# Patient Record
Sex: Male | Born: 1950
Health system: Southern US, Community
[De-identification: ages and names within clinical notes are randomized; demographics above are authoritative.]

## PROBLEM LIST (undated history)

## (undated) DIAGNOSIS — F32A Depression, unspecified: Secondary | ICD-10-CM

## (undated) DIAGNOSIS — E785 Hyperlipidemia, unspecified: Secondary | ICD-10-CM

## (undated) DIAGNOSIS — I10 Essential (primary) hypertension: Secondary | ICD-10-CM

## (undated) DIAGNOSIS — F1910 Other psychoactive substance abuse, uncomplicated: Secondary | ICD-10-CM

## (undated) DIAGNOSIS — K219 Gastro-esophageal reflux disease without esophagitis: Secondary | ICD-10-CM

## (undated) DIAGNOSIS — F329 Major depressive disorder, single episode, unspecified: Secondary | ICD-10-CM

## (undated) HISTORY — DX: Gastro-esophageal reflux disease without esophagitis: K21.9

## (undated) HISTORY — DX: Depression, unspecified: F32.A

## (undated) HISTORY — DX: Major depressive disorder, single episode, unspecified: F32.9

## (undated) HISTORY — DX: Other psychoactive substance abuse, uncomplicated: F19.10

## (undated) HISTORY — DX: Essential (primary) hypertension: I10

## (undated) HISTORY — DX: Hyperlipidemia, unspecified: E78.5

## (undated) HISTORY — PX: EYE SURGERY: SHX253

---

## 2005-02-07 ENCOUNTER — Ambulatory Visit: Payer: Self-pay | Admitting: Internal Medicine

## 2005-04-14 ENCOUNTER — Ambulatory Visit: Payer: Self-pay | Admitting: Internal Medicine

## 2006-05-11 ENCOUNTER — Ambulatory Visit: Payer: Self-pay | Admitting: Internal Medicine

## 2006-06-22 ENCOUNTER — Ambulatory Visit: Payer: Self-pay | Admitting: Internal Medicine

## 2006-09-23 ENCOUNTER — Ambulatory Visit: Payer: Self-pay | Admitting: Family Medicine

## 2006-12-24 ENCOUNTER — Ambulatory Visit: Payer: Self-pay | Admitting: Internal Medicine

## 2007-02-01 ENCOUNTER — Ambulatory Visit: Payer: Self-pay | Admitting: Internal Medicine

## 2007-05-26 ENCOUNTER — Ambulatory Visit: Payer: Self-pay | Admitting: Internal Medicine

## 2007-05-26 DIAGNOSIS — I10 Essential (primary) hypertension: Secondary | ICD-10-CM | POA: Insufficient documentation

## 2007-05-26 DIAGNOSIS — F329 Major depressive disorder, single episode, unspecified: Secondary | ICD-10-CM | POA: Insufficient documentation

## 2007-05-26 DIAGNOSIS — F339 Major depressive disorder, recurrent, unspecified: Secondary | ICD-10-CM | POA: Insufficient documentation

## 2007-06-02 ENCOUNTER — Telehealth (INDEPENDENT_AMBULATORY_CARE_PROVIDER_SITE_OTHER): Payer: Self-pay | Admitting: *Deleted

## 2007-06-02 LAB — CONVERTED CEMR LAB
BUN: 16 mg/dL (ref 6–23)
Basophils Absolute: 0 10*3/uL (ref 0.0–0.1)
Basophils Relative: 0 % (ref 0.0–1.0)
CO2: 28 meq/L (ref 19–32)
Calcium: 10 mg/dL (ref 8.4–10.5)
Chloride: 102 meq/L (ref 96–112)
Creatinine, Ser: 1.6 mg/dL — ABNORMAL HIGH (ref 0.4–1.5)
Eosinophils Absolute: 0.3 10*3/uL (ref 0.0–0.6)
Eosinophils Relative: 3.1 % (ref 0.0–5.0)
GFR calc Af Amer: 58 mL/min
GFR calc non Af Amer: 48 mL/min
Glucose, Bld: 93 mg/dL (ref 70–99)
HCT: 40.9 % (ref 39.0–52.0)
Hemoglobin: 13.9 g/dL (ref 13.0–17.0)
Lymphocytes Relative: 18.3 % (ref 12.0–46.0)
MCHC: 33.9 g/dL (ref 30.0–36.0)
MCV: 96.3 fL (ref 78.0–100.0)
Monocytes Absolute: 0.6 10*3/uL (ref 0.2–0.7)
Monocytes Relative: 6.9 % (ref 3.0–11.0)
Neutro Abs: 5.8 10*3/uL (ref 1.4–7.7)
Neutrophils Relative %: 71.7 % (ref 43.0–77.0)
Platelets: 546 10*3/uL — ABNORMAL HIGH (ref 150–400)
Potassium: 4 meq/L (ref 3.5–5.1)
RBC: 4.25 M/uL (ref 4.22–5.81)
RDW: 11.9 % (ref 11.5–14.6)
Sodium: 138 meq/L (ref 135–145)
TSH: 2.7 microintl units/mL (ref 0.35–5.50)
WBC: 8.2 10*3/uL (ref 4.5–10.5)

## 2007-07-16 ENCOUNTER — Ambulatory Visit: Payer: Self-pay | Admitting: Internal Medicine

## 2007-08-16 ENCOUNTER — Ambulatory Visit: Payer: Self-pay | Admitting: Internal Medicine

## 2007-10-01 ENCOUNTER — Ambulatory Visit: Payer: Self-pay | Admitting: Internal Medicine

## 2008-06-16 ENCOUNTER — Ambulatory Visit: Payer: Self-pay | Admitting: Internal Medicine

## 2008-06-16 DIAGNOSIS — D759 Disease of blood and blood-forming organs, unspecified: Secondary | ICD-10-CM | POA: Insufficient documentation

## 2008-06-16 LAB — CONVERTED CEMR LAB
BUN: 14 mg/dL (ref 6–23)
CO2: 30 meq/L (ref 19–32)
Eosinophils Relative: 2.9 % (ref 0.0–5.0)
GFR calc Af Amer: 81 mL/min
Glucose, Bld: 98 mg/dL (ref 70–99)
HCT: 40.9 % (ref 39.0–52.0)
Hemoglobin: 14.3 g/dL (ref 13.0–17.0)
Lymphocytes Relative: 24.8 % (ref 12.0–46.0)
Monocytes Absolute: 0.5 10*3/uL (ref 0.1–1.0)
Monocytes Relative: 9.8 % (ref 3.0–12.0)
Neutro Abs: 3.1 10*3/uL (ref 1.4–7.7)
Platelets: 428 10*3/uL — ABNORMAL HIGH (ref 150–400)
Potassium: 4 meq/L (ref 3.5–5.1)
RDW: 12.2 % (ref 11.5–14.6)
Sodium: 141 meq/L (ref 135–145)
WBC: 5 10*3/uL (ref 4.5–10.5)

## 2008-10-09 ENCOUNTER — Ambulatory Visit: Payer: Self-pay | Admitting: Internal Medicine

## 2008-10-09 LAB — CONVERTED CEMR LAB
Cholesterol: 235 mg/dL (ref 0–200)
Direct LDL: 164.3 mg/dL
Total CHOL/HDL Ratio: 5.6
Triglycerides: 218 mg/dL (ref 0–149)
VLDL: 44 mg/dL — ABNORMAL HIGH (ref 0–40)

## 2008-10-30 ENCOUNTER — Telehealth: Payer: Self-pay | Admitting: Internal Medicine

## 2008-11-02 ENCOUNTER — Telehealth: Payer: Self-pay | Admitting: Internal Medicine

## 2009-01-15 ENCOUNTER — Ambulatory Visit: Payer: Self-pay | Admitting: Internal Medicine

## 2009-01-15 DIAGNOSIS — M26609 Unspecified temporomandibular joint disorder, unspecified side: Secondary | ICD-10-CM | POA: Insufficient documentation

## 2009-01-29 ENCOUNTER — Telehealth: Payer: Self-pay | Admitting: Internal Medicine

## 2009-02-07 ENCOUNTER — Telehealth: Payer: Self-pay | Admitting: Internal Medicine

## 2009-05-16 ENCOUNTER — Ambulatory Visit: Payer: Self-pay | Admitting: Family Medicine

## 2009-05-16 DIAGNOSIS — R6882 Decreased libido: Secondary | ICD-10-CM | POA: Insufficient documentation

## 2009-05-16 DIAGNOSIS — E785 Hyperlipidemia, unspecified: Secondary | ICD-10-CM | POA: Insufficient documentation

## 2009-05-17 LAB — CONVERTED CEMR LAB
Direct LDL: 180.2 mg/dL
HDL: 49.7 mg/dL (ref >39.00)
Testosterone: 257.42 ng/dL — ABNORMAL LOW (ref 350.00–890.00)
Total CHOL/HDL Ratio: 5
VLDL: 27.4 mg/dL (ref 0.0–40.0)

## 2009-05-29 ENCOUNTER — Telehealth (INDEPENDENT_AMBULATORY_CARE_PROVIDER_SITE_OTHER): Payer: Self-pay | Admitting: *Deleted

## 2009-06-19 ENCOUNTER — Telehealth (INDEPENDENT_AMBULATORY_CARE_PROVIDER_SITE_OTHER): Payer: Self-pay | Admitting: *Deleted

## 2009-08-06 ENCOUNTER — Telehealth: Payer: Self-pay | Admitting: Internal Medicine

## 2009-08-07 ENCOUNTER — Encounter: Payer: Self-pay | Admitting: Internal Medicine

## 2009-10-26 ENCOUNTER — Ambulatory Visit: Payer: Self-pay | Admitting: Internal Medicine

## 2009-10-26 DIAGNOSIS — J069 Acute upper respiratory infection, unspecified: Secondary | ICD-10-CM | POA: Insufficient documentation

## 2009-10-26 LAB — CONVERTED CEMR LAB: AST: 38 units/L — ABNORMAL HIGH (ref 0–37)

## 2010-02-19 ENCOUNTER — Ambulatory Visit: Payer: Self-pay | Admitting: Internal Medicine

## 2010-02-19 DIAGNOSIS — K5289 Other specified noninfective gastroenteritis and colitis: Secondary | ICD-10-CM | POA: Insufficient documentation

## 2010-10-22 NOTE — Assessment & Plan Note (Signed)
Summary: GI ISSUES//CCM   Vital Signs:  Patient profile:   60 year old male Weight:      180 pounds Temp:     97.7 degrees F oral BP sitting:   110 / 78  (left arm) Cuff size:   regular  Vitals Entered By: Duard Brady LPN (Feb 19, 2010 4:30 PM) CC: c/o n/v/d/ low grade fever since the weekend Is Patient Diabetic? No   CC:  c/o n/v/d/ low grade fever since the weekend.  History of Present Illness: 60 -year-old patient  who presents with a 3-day history of nausea, vomiting, and diarrhea.  He is improved to the and has not had any diarrhea or vomiting.  In 24 hours.  He states that he is hungry, but has not eaten today.  Denies any abdominal pain.  He has treated hypertension and dyslipidemia.  Preventive Screening-Counseling & Management  Alcohol-Tobacco     Smoking Status: never  Allergies: 1)  Amoxicillin (Amoxicillin) 2)  Penicillin V Potassium (Penicillin V Potassium)  Physical Exam  General:  Well-developed,well-nourished,in no acute distress; alert,appropriate and cooperative throughout examination Head:  Normocephalic and atraumatic without obvious abnormalities. No apparent alopecia or balding. Eyes:  No corneal or conjunctival inflammation noted. EOMI. Perrla. Funduscopic exam benign, without hemorrhages, exudates or papilledema. Vision grossly normal. Ears:  External ear exam shows no significant lesions or deformities.  Otoscopic examination reveals clear canals, tympanic membranes are intact bilaterally without bulging, retraction, inflammation or discharge. Hearing is grossly normal bilaterally. Mouth:  Oral mucosa and oropharynx without lesions or exudates.  Teeth in good repair. Neck:  No deformities, masses, or tenderness noted. Lungs:  Normal respiratory effort, chest expands symmetrically. Lungs are clear to auscultation, no crackles or wheezes. Heart:  Normal rate and regular rhythm. S1 and S2 normal without gallop, murmur, click, rub or other extra  sounds.  no  tachycardia Abdomen:  Bowel sounds positive,abdomen soft and non-tender without masses, organomegaly or hernias noted.   Impression & Recommendations:  Problem # 1:  GASTROENTERITIS (ICD-558.9)  Problem # 2:  HYPERTENSION (ICD-401.9)  His updated medication list for this problem includes:    Benicar Hct 40-25 Mg Tabs (Olmesartan medoxomil-hctz) .Marland Kitchen... 1 once daily will hold Benicar until he is tolerating a regular diet  Complete Medication List: 1)  Cymbalta 60 Mg Cpep (Duloxetine hcl) .Marland Kitchen.. 1  daily 2)  Androgel 50 Mg/5gm Gel (Testosterone) .... Use every am 3)  Simvastatin 40 Mg Tabs (Simvastatin) .... Take 1 tablet by mouth once a day 4)  Benicar Hct 40-25 Mg Tabs (Olmesartan medoxomil-hctz) .Marland Kitchen.. 1 once daily 5)  Promethazine Hcl 25 Mg Tabs (Promethazine hcl) .... One every 6 hours for nausea  Patient Instructions: 1)  The  main problem with gastroenteritis is dehydration. Drink plenty of fluids and take solids as you feel better. If you are unable to keep anything down and/or you show signs of dehydration(dry/cracked lips, lack of tears, not urinating, very sleepy), call our office. Prescriptions: PROMETHAZINE HCL 25 MG TABS (PROMETHAZINE HCL) one every 6 hours for nausea  #12 x 0   Entered and Authorized by:   Gordy Savers  MD   Signed by:   Gordy Savers  MD on 02/19/2010   Method used:   Print then Give to Patient   RxID:   [id number]

## 2010-10-22 NOTE — Assessment & Plan Note (Signed)
Summary: FEVER, ACHING, CONGESTION (FLU-LIKE SYMPTOMS) // RS   Vital Signs:  Patient profile:   60 year old male Weight:      183 pounds Temp:     97.5 degrees F oral BP sitting:   108 / 84  (left arm) Cuff size:   regular  Vitals Entered By: Raechel Ache, RN (October 26, 2009 1:23 PM) CC: C/o body aches, congestion, headache and cough   CC:  C/o body aches, congestion, and headache and cough.  History of Present Illness: 60 year old patient has a history of hypertension, and dyslipidemia, who presents with a one-week history of a flulike illness.  He has had cough, congestion, and a predominant symptom of muscle aches and pain.  He has felt feverish and has some occasional mild chills.  No sputum production, chest pain or shortness of breath.  He was placed on simvastatin for a number of months ago.  Allergies: 1)  Amoxicillin (Amoxicillin) 2)  Penicillin V Potassium (Penicillin V Potassium)  Past History:  Past Medical History: Reviewed history from 05/26/2007 and no changes required. Depression Hypertension  Review of Systems       The patient complains of anorexia, fever, and peripheral edema.  The patient denies weight loss, weight gain, vision loss, decreased hearing, hoarseness, chest pain, syncope, dyspnea on exertion, prolonged cough, headaches, hemoptysis, abdominal pain, melena, hematochezia, severe indigestion/heartburn, hematuria, incontinence, genital sores, muscle weakness, suspicious skin lesions, transient blindness, difficulty walking, depression, unusual weight change, abnormal bleeding, enlarged lymph nodes, angioedema, breast masses, and testicular masses.    Physical Exam  General:  Well-developed,well-nourished,in no acute distress; alert,appropriate and cooperative throughout examination Head:  Normocephalic and atraumatic without obvious abnormalities. No apparent alopecia or balding. Eyes:  No corneal or conjunctival inflammation noted. EOMI.  Perrla. Funduscopic exam benign, without hemorrhages, exudates or papilledema. Vision grossly normal. Ears:  External ear exam shows no significant lesions or deformities.  Otoscopic examination reveals clear canals, tympanic membranes are intact bilaterally without bulging, retraction, inflammation or discharge. Hearing is grossly normal bilaterally. Mouth:  Oral mucosa and oropharynx without lesions or exudates.  Teeth in good repair. Neck:  No deformities, masses, or tenderness noted. Lungs:  Normal respiratory effort, chest expands symmetrically. Lungs are clear to auscultation, no crackles or wheezes. O2 saturation 98 Heart:  Normal rate and regular rhythm. S1 and S2 normal without gallop, murmur, click, rub or other extra sounds. pulse rate 72   Impression & Recommendations:  Problem # 1:  URI (ICD-465.9)  Problem # 2:  HYPERLIPIDEMIA (ICD-272.4)  His updated medication list for this problem includes:    Simvastatin 40 Mg Tabs (Simvastatin) .Marland Kitchen... Take 1 tablet by mouth once a day  Orders: Venipuncture (30160) TLB-AST (SGOT) (84450-SGOT) TLB-Cholesterol, Total (82465-CHO)  Problem # 3:  HYPERTENSION (ICD-401.9)  His updated medication list for this problem includes:    Benicar Hct 40-25 Mg Tabs (Olmesartan medoxomil-hctz) .Marland Kitchen... 1 once daily  Complete Medication List: 1)  Cymbalta 60 Mg Cpep (Duloxetine hcl) .Marland Kitchen.. 1  daily 2)  Androgel 50 Mg/5gm Gel (Testosterone) .... Use every am 3)  Simvastatin 40 Mg Tabs (Simvastatin) .... Take 1 tablet by mouth once a day 4)  Benicar Hct 40-25 Mg Tabs (Olmesartan medoxomil-hctz) .Marland Kitchen.. 1 once daily 5)  Hydrocodone-acetaminophen 5-500 Mg Tabs (Hydrocodone-acetaminophen) .... One every 6 hours as needed for pain, fever, or cough  Patient Instructions: 1)  Please schedule a follow-up appointment in 3 months. 2)  Limit your Sodium (Salt). 3)  It is important that you exercise regularly at least 20 minutes 5 times a week. If you develop chest  pain, have severe difficulty breathing, or feel very tired , stop exercising immediately and seek medical attention. Prescriptions: HYDROCODONE-ACETAMINOPHEN 5-500 MG TABS (HYDROCODONE-ACETAMINOPHEN) one every 6 hours as needed for pain, fever, or cough  #30 x 0   Entered and Authorized by:   Gordy Savers  MD   Signed by:   Gordy Savers  MD on 10/26/2009   Method used:   Print then Give to Patient   RxID:   [id number] SIMVASTATIN 40 MG TABS (SIMVASTATIN) Take 1 tablet by mouth once a day  #90 Tablet x 6   Entered and Authorized by:   Gordy Savers  MD   Signed by:   Gordy Savers  MD on 10/26/2009   Method used:   Print then Give to Patient   RxID:   [id number] BENICAR HCT 40-25 MG TABS (OLMESARTAN MEDOXOMIL-HCTZ) 1 once daily  #90 x 6   Entered and Authorized by:   Gordy Savers  MD   Signed by:   Gordy Savers  MD on 10/26/2009   Method used:   Print then Give to Patient   RxID:   [id number] ANDROGEL 50 MG/5GM GEL (TESTOSTERONE) use every am  #30 x 6   Entered and Authorized by:   Gordy Savers  MD   Signed by:   Gordy Savers  MD on 10/26/2009   Method used:   Print then Give to Patient   RxID:   [id number] CYMBALTA 60 MG CPEP (DULOXETINE HCL) 1  daily  #90 x 4   Entered and Authorized by:   Gordy Savers  MD   Signed by:   Gordy Savers  MD on 10/26/2009   Method used:   Print then Give to Patient   RxID:   [id number]

## 2010-11-21 ENCOUNTER — Other Ambulatory Visit: Payer: Self-pay | Admitting: Internal Medicine

## 2010-12-08 ENCOUNTER — Other Ambulatory Visit: Payer: Self-pay | Admitting: Internal Medicine

## 2010-12-08 DIAGNOSIS — E785 Hyperlipidemia, unspecified: Secondary | ICD-10-CM

## 2011-02-07 NOTE — Assessment & Plan Note (Signed)
[facility]                              [facility] OFFICE NOTE   NAME:Sean Mejia, Sean Mejia                      MRN:          [id number]  DATE:05/11/2006                            DOB:          [birth year]    A 60 year old gentleman seen today for an annual exam.  He has a history of  borderline hypertension in the past.  He is followed by Dr. Lafayette Dragon for a  history of severe depression.  Past medical history otherwise fairly  unremarkable.  Has had bilateral eye surgery for pterygiums in the past.   FAMILY HISTORY:  Positive for premature coronary artery disease.  He has had  two ET tubes in the past, the last done in 2005.  He had a colonoscopy also  in 2005.   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   EXAMINATION:  GENERAL:  Reveals a healthy-appearing fit male.  In no acute  distress.  VITAL SIGNS:  Blood pressure was 160/104.  HEENT:  Fundi, ears, nose and throat clear.  NECK:  No bruits or adenopathy.  CHEST:  Clear.  CARDIOVASCULAR:  Normal heart sounds, no murmurs.  ABDOMEN:  Benign.  EXTERNAL GENITALIA:  Normal.  RECTAL:  Prostate minimally enlarged and stool was heme test negative.  EXTREMITIES:  Reveal faint peripheral pulses.  Feet were slightly cool.  NEURO:  Negative.   IMPRESSION:  1. Hypertension.  2. History of severe depression.   DISPOSITION:  Will place on Benicar 20/12.5.  Will recheck in 6 weeks.  Laboratory studies reviewed.                                   Gordy Savers, MD   PFK/MedQ  DD:  05/11/2006  DT:  05/11/2006  Job #:  [id number]

## 2011-02-28 ENCOUNTER — Other Ambulatory Visit: Payer: Self-pay | Admitting: Internal Medicine

## 2011-03-11 ENCOUNTER — Other Ambulatory Visit: Payer: Self-pay | Admitting: Internal Medicine

## 2011-03-18 ENCOUNTER — Other Ambulatory Visit (INDEPENDENT_AMBULATORY_CARE_PROVIDER_SITE_OTHER): Payer: BC Managed Care – PPO

## 2011-03-18 DIAGNOSIS — E291 Testicular hypofunction: Secondary | ICD-10-CM

## 2011-03-18 DIAGNOSIS — E349 Endocrine disorder, unspecified: Secondary | ICD-10-CM

## 2011-03-18 DIAGNOSIS — Z0000 Encounter for general adult medical examination without abnormal findings: Secondary | ICD-10-CM

## 2011-03-18 LAB — POCT URINALYSIS DIPSTICK
Blood, UA: NEGATIVE
Nitrite, UA: NEGATIVE
Urobilinogen, UA: 0.2
pH, UA: 5.5

## 2011-03-18 LAB — HEPATIC FUNCTION PANEL
Bilirubin, Direct: 0.1 mg/dL (ref 0.0–0.3)
Total Bilirubin: 0.4 mg/dL (ref 0.3–1.2)

## 2011-03-18 LAB — TSH: TSH: 0.94 u[IU]/mL (ref 0.35–5.50)

## 2011-03-18 LAB — CBC WITH DIFFERENTIAL/PLATELET
Eosinophils Absolute: 0.1 10*3/uL (ref 0.0–0.7)
Lymphocytes Relative: 25.2 % (ref 12.0–46.0)
MCHC: 33.9 g/dL (ref 30.0–36.0)
MCV: 94.3 fl (ref 78.0–100.0)
Monocytes Absolute: 0.6 10*3/uL (ref 0.1–1.0)
Neutrophils Relative %: 60.3 % (ref 43.0–77.0)
Platelets: 326 10*3/uL (ref 150.0–400.0)
WBC: 5.5 10*3/uL (ref 4.5–10.5)

## 2011-03-18 LAB — BASIC METABOLIC PANEL
BUN: 18 mg/dL (ref 6–23)
Calcium: 9.7 mg/dL (ref 8.4–10.5)
Creatinine, Ser: 1.1 mg/dL (ref 0.4–1.5)
GFR: 73.03 mL/min (ref >60.00)
Glucose, Bld: 75 mg/dL (ref 70–99)

## 2011-03-18 LAB — LIPID PANEL
HDL: 48.3 mg/dL (ref >39.00)
LDL Cholesterol: 92 mg/dL (ref 0–99)
Total CHOL/HDL Ratio: 3
Triglycerides: 142 mg/dL (ref 0.0–149.0)
VLDL: 28.4 mg/dL (ref 0.0–40.0)

## 2011-03-27 ENCOUNTER — Encounter: Payer: Self-pay | Admitting: Internal Medicine

## 2011-03-31 ENCOUNTER — Ambulatory Visit (INDEPENDENT_AMBULATORY_CARE_PROVIDER_SITE_OTHER): Payer: BC Managed Care – PPO | Admitting: Internal Medicine

## 2011-03-31 ENCOUNTER — Encounter: Payer: Self-pay | Admitting: Internal Medicine

## 2011-03-31 VITALS — BP 100/60 | HR 86 | Temp 98.5°F | Resp 16 | Ht 68.75 in | Wt 184.0 lb

## 2011-03-31 DIAGNOSIS — Z0000 Encounter for general adult medical examination without abnormal findings: Secondary | ICD-10-CM

## 2011-03-31 DIAGNOSIS — Z23 Encounter for immunization: Secondary | ICD-10-CM

## 2011-03-31 MED ORDER — DULOXETINE HCL 60 MG PO CPEP: 60.0000 mg | ORAL_CAPSULE | Freq: Every day | ORAL | Status: DC

## 2011-03-31 MED ORDER — OLMESARTAN MEDOXOMIL-HCTZ 40-25 MG PO TABS: 1.0000 | ORAL_TABLET | Freq: Every day | ORAL | Status: DC

## 2011-03-31 MED ORDER — SIMVASTATIN 40 MG PO TABS: 40.0000 mg | ORAL_TABLET | Freq: Every day | ORAL | Status: DC

## 2011-03-31 MED ORDER — TESTOSTERONE 20.25 MG/ACT (1.62%) TD GEL: 2.0000 "application " | TRANSDERMAL | Status: DC

## 2011-03-31 NOTE — Patient Instructions (Signed)
It is important that you exercise regularly, at least 20 minutes 3 to 4 times per week.  If you develop chest pain or shortness of breath seek  medical attention.  Please check your blood pressure on a regular basis.  If it is consistently greater than 150/90, please make an office appointment.  Return in 6 months for follow-up  

## 2011-03-31 NOTE — Progress Notes (Signed)
  Subjective:    Patient ID: Sean Mejia, male    DOB: 1951-08-09, 60 y.o.   MRN: [id number]  HPIHistory of Present Illness:   60 year-old gentleman seen today for a comprehensive annual exam. medical problems include hypertension, and bipolar depression; he is on simvastatin for dyslipidemia. He also has a history of testosterone deficiency  Current Allergies:  AMOXICILLIN (AMOXICILLIN)   Past Medical History:  Reviewed history from 05/26/2007 and no changes required:  Depression  Hypertension   Past Surgical History:  Reviewed history from 05/26/2007 and no changes required:  Denies surgical history  colonoscopy age 60   Family History:  father deceased from MS age 51- of acute MI  mother deceased from copd age 6; history of SLE  Four brothers, one with a history of bladder cancer      Review of Systems  Constitutional: Negative for fever, chills, activity change, appetite change and fatigue.  HENT: Negative for hearing loss, ear pain, congestion, rhinorrhea, sneezing, mouth sores, trouble swallowing, neck pain, neck stiffness, dental problem, voice change, sinus pressure and tinnitus.   Eyes: Negative for photophobia, pain, redness and visual disturbance.  Respiratory: Negative for apnea, cough, choking, chest tightness, shortness of breath and wheezing.   Cardiovascular: Negative for chest pain, palpitations and leg swelling.  Gastrointestinal: Negative for nausea, vomiting, abdominal pain, diarrhea, constipation, blood in stool, abdominal distention, anal bleeding and rectal pain.  Genitourinary: Negative for dysuria, urgency, frequency, hematuria, flank pain, decreased urine volume, discharge, penile swelling, scrotal swelling, difficulty urinating, genital sores and testicular pain.  Musculoskeletal: Negative for myalgias, back pain, joint swelling, arthralgias and gait problem.  Skin: Negative for color change, rash and wound.  Neurological: Negative for dizziness,  tremors, seizures, syncope, facial asymmetry, speech difficulty, weakness, light-headedness, numbness and headaches.  Hematological: Negative for adenopathy. Does not bruise/bleed easily.  Psychiatric/Behavioral: Negative for suicidal ideas, hallucinations, behavioral problems, confusion, sleep disturbance, self-injury, dysphoric mood, decreased concentration and agitation. The patient is not nervous/anxious.        Objective:   Physical Exam  Constitutional: He appears well-developed and well-nourished.  HENT:  Head: Normocephalic and atraumatic.  Right Ear: External ear normal.  Left Ear: External ear normal.  Nose: Nose normal.  Mouth/Throat: Oropharynx is clear and moist.  Eyes: Conjunctivae and EOM are normal. Pupils are equal, round, and reactive to light. No scleral icterus.  Neck: Normal range of motion. Neck supple. No JVD present. No thyromegaly present.  Cardiovascular: Regular rhythm, normal heart sounds and intact distal pulses.  Exam reveals no gallop and no friction rub.   No murmur heard. Pulmonary/Chest: Effort normal and breath sounds normal. He exhibits no tenderness.  Abdominal: Soft. Bowel sounds are normal. He exhibits no distension and no mass. There is no tenderness.  Genitourinary: Prostate normal and penis normal.  Musculoskeletal: Normal range of motion. He exhibits no edema and no tenderness.  Lymphadenopathy:    He has no cervical adenopathy.  Neurological: He is alert. He has normal reflexes. No cranial nerve deficit. Coordination normal.  Skin: Skin is warm and dry. No rash noted.  Psychiatric: He has a normal mood and affect. His behavior is normal.          Assessment & Plan:   Annual health assessment Hypertension well controlled Dyslipidemia Testosterone deficiency. We'll try AndroGel pump 1.62%

## 2011-05-07 ENCOUNTER — Other Ambulatory Visit: Payer: Self-pay | Admitting: Internal Medicine

## 2011-08-19 ENCOUNTER — Other Ambulatory Visit: Payer: Self-pay | Admitting: Internal Medicine

## 2011-10-20 ENCOUNTER — Other Ambulatory Visit: Payer: Self-pay

## 2011-10-20 NOTE — Telephone Encounter (Signed)
ok 

## 2011-10-20 NOTE — Telephone Encounter (Signed)
Rx request for androgel 1.62% place 2 applications to skin once daily.  Rx last filled 03/31/11 and pt last seen same day.  Pls advise.

## 2011-10-21 MED ORDER — TESTOSTERONE 20.25 MG/ACT (1.62%) TD GEL: 2.0000 "application " | TRANSDERMAL | Status: DC

## 2011-10-21 NOTE — Telephone Encounter (Signed)
Rx called in to pharmacy. 

## 2011-11-14 ENCOUNTER — Ambulatory Visit (HOSPITAL_COMMUNITY)
Admission: RE | Admit: 2011-11-14 | Discharge: 2011-11-14 | Disposition: A | Discharge status: Home / Self Care | Payer: BC Managed Care – PPO | Attending: Psychiatry | Admitting: Psychiatry

## 2011-11-14 DIAGNOSIS — F329 Major depressive disorder, single episode, unspecified: Secondary | ICD-10-CM | POA: Insufficient documentation

## 2011-11-14 DIAGNOSIS — F3289 Other specified depressive episodes: Secondary | ICD-10-CM | POA: Insufficient documentation

## 2011-11-14 NOTE — BH Assessment (Signed)
Assessment Note   Sean Mejia is an 61 y.o. male who presented as a walk-in accompanied by his wife and referred by his current psychiatrist for assessment. Sean Mejia presents severely depressed. He states that he has been increasingly more depressed since last Fall. States that there has been changes at work (new employee getting under his skin) and things not going well at work as his main stressor also identifies increased disagreements with his wife d/t increased depression and relapse of alcohol 2 years ago after being sober for years and now only having a 2 month span of sobriety as stressors as well. States that in the last 2 weeks he has become more isolative, increased self-loathing, feels worthless, does not want to be here. Patient states that he got into a disagreement with his wife of 20 years on Wednesday and left and stayed in a hotel. Patient states that on Wednes night he was feeling extremely suicidal and walked to the train track stood on the track and really wanted to be hit by a train but changed his mind and stepped off track. He stated that on Thursday night he walked down to train track again and contemplated standing on track again but did not. He stated that he doesn't feel suicidal today just very depressed.   We discussed the option of IOP or inpatient. He stated that he really did not care for group therapy and asked that I tell him what inpatient was like. He asked if  I could  ask his wife to come back into room so that they could discuss the options. When I returned patient and his wife had let AMA. I attempted to contact the patient on his home phone x3 and left a message for him to call back in the office. I also contacted Dr. Donell Beers to discuss the outcome of the assessment and the fact that the patient had left AMA and  that I did not feel that the patient posed any threat to himself and that he was with his wife. Dr. Donell Beers stated that he would continue to attempt   reach the patient and that he would schedule a follow up appointment with him for next week.   Axis I: Depressive Disorder NOS Axis II: Deferred Axis III:  Past Medical History  Diagnosis Date  . Depression   . Hypertension    Axis IV: occupational problems, other psychosocial or environmental problems and problems related to social environment Axis V: 41-50 serious symptoms  Past Medical History:  Past Medical History  Diagnosis Date  . Depression   . Hypertension     No past surgical history on file.  Family History:  Family History  Problem Relation Age of Onset  . COPD Mother   . Lupus Mother   . Heart attack Father   . Cancer Brother     bladder    Social History:  reports that he has never smoked. He has never used smokeless tobacco. He reports that he drinks alcohol. He reports that he does not use illicit drugs.  Additional Social History:    Allergies:  Allergies  Allergen Reactions  . Amoxicillin     REACTION: unspecified  . Penicillins     REACTION: hives    Home Medications:  Medications Prior to Admission  Medication Sig Dispense Refill  . BENICAR HCT 40-25 MG per tablet TAKE 1 TABLET BY MOUTH EVERY DAY  90 tablet  3  . DULoxetine (CYMBALTA) 60 MG capsule  Take 1 capsule (60 mg total) by mouth daily.  90 capsule  6  . simvastatin (ZOCOR) 40 MG tablet Take 1 tablet (40 mg total) by mouth at bedtime.  906 tablet  0  . Testosterone (ANDROGEL PUMP) 20.25 MG/ACT (1.62%) GEL Place 2 application onto the skin 1 day or 1 dose.  75 g  5  . olmesartan-hydrochlorothiazide (BENICAR HCT) 40-25 MG per tablet Take 1 tablet by mouth daily.  90 tablet  6   No current facility-administered medications on file as of 11/14/2011.    OB/GYN Status:  No LMP for male patient.  General Assessment Data Location of Assessment: [facility] Living Arrangements: Spouse/significant other Can pt return to current living arrangement?: Yes Admission Status:  Voluntary Is patient capable of signing voluntary admission?: Yes Transfer from: Home Referral Source: Psychiatrist  Education Status Is patient currently in school?: No  Risk to self Suicidal Ideation: No-Not Currently/Within Last 6 Months Suicidal Intent: No-Not Currently/Within Last 6 Months Is patient at risk for suicide?: No Suicidal Plan?: No Access to Means: No What has been your use of drugs/alcohol within the last 12 months?:  (No alcohol in the past 2 months) Previous Attempts/Gestures: Yes How many times?:  (twice in last 2 days) Other Self Harm Risks:  (No) Triggers for Past Attempts:  (None) Intentional Self Injurious Behavior: None Family Suicide History:  (Father/ Depression; 3 brothers/depression) Recent stressful life event(s):  (None identified) Persecutory voices/beliefs?: Yes Depression: Yes Depression Symptoms: Despondent;Insomnia;Tearfulness;Isolating;Fatigue;Loss of interest in usual pleasures;Feeling worthless/self pity;Feeling angry/irritable Substance abuse history and/or treatment for substance abuse?: Yes Suicide prevention information given to non-admitted patients:  (No)  Risk to Others Homicidal Ideation: No Thoughts of Harm to Others: No Current Homicidal Intent: No Current Homicidal Plan: No Access to Homicidal Means: No History of harm to others?: No Assessment of Violence: None Noted Violent Behavior Description:  (None) Does patient have access to weapons?: No Criminal Charges Pending?: No Does patient have a court date: No  Psychosis Hallucinations: None noted Delusions: None noted  Mental Status Report Appear/Hygiene:  (WNL) Eye Contact: Fair Motor Activity: Unremarkable Speech: Soft;Slow Level of Consciousness: Alert Mood: Depressed;Ambivalent;Anhedonia;Sad;Sullen;Worthless, low self-esteem Affect:  (Flat) Anxiety Level: None Thought Processes: Coherent;Relevant Judgement: Impaired Orientation:  Person;Place;Time;Situation Obsessive Compulsive Thoughts/Behaviors: None  Cognitive Functioning Concentration: Decreased Memory: Recent Intact;Remote Intact IQ: Average Insight: Fair Impulse Control: Good Appetite: Fair Sleep:  (Difficulty falling asleep,difficulty staying asleep) Total Hours of Sleep:  (Varies) Vegetative Symptoms: Staying in bed  Prior Inpatient Therapy Prior Inpatient Therapy: No  Prior Outpatient Therapy Prior Outpatient Therapy: Yes (Dr. Donell Beers) Prior Therapy Dates:  (Current) Prior Therapy Facilty/Provider(s):  (Psychiatry) Reason for Treatment:  (Medication management)                     Additional Information 1:1 In Past 12 Months?: No CIRT Risk: No Elopement Risk: No Does patient have medical clearance?: No     Disposition:  Disposition Disposition of Patient: Outpatient treatment Type of outpatient treatment: Adult  On Site Evaluation by:   Reviewed with Physician:     Lavonia Dana 11/14/2011 5:45 PM

## 2011-11-20 ENCOUNTER — Telehealth: Payer: Self-pay | Admitting: Internal Medicine

## 2011-11-20 DIAGNOSIS — E349 Endocrine disorder, unspecified: Secondary | ICD-10-CM

## 2011-11-20 NOTE — Telephone Encounter (Signed)
Patient called requesting to be scheduled for labs to have his testosterone levels checked. Please advise.

## 2011-11-20 NOTE — Telephone Encounter (Signed)
Pt on androgel , last checked 02/2011 Please advise

## 2011-11-20 NOTE — Telephone Encounter (Signed)
Please call and schedule early AM lab only appt.  I have pt order in

## 2011-11-20 NOTE — Telephone Encounter (Signed)
Please schedule and check any morning

## 2011-11-24 ENCOUNTER — Other Ambulatory Visit: Payer: Self-pay

## 2011-11-25 ENCOUNTER — Other Ambulatory Visit (INDEPENDENT_AMBULATORY_CARE_PROVIDER_SITE_OTHER): Payer: BC Managed Care – PPO

## 2011-11-25 DIAGNOSIS — E349 Endocrine disorder, unspecified: Secondary | ICD-10-CM

## 2011-11-25 DIAGNOSIS — E291 Testicular hypofunction: Secondary | ICD-10-CM

## 2011-11-26 ENCOUNTER — Telehealth: Payer: Self-pay

## 2011-11-26 DIAGNOSIS — R5383 Other fatigue: Secondary | ICD-10-CM

## 2011-11-26 DIAGNOSIS — E349 Endocrine disorder, unspecified: Secondary | ICD-10-CM

## 2011-11-26 NOTE — Progress Notes (Signed)
Quick Note:  Spoke with pt - informed of lab results and dr. Vernon Prey instructions - he is using med daily - instructed to double dose. Also inquired about seeing a urologist? ______

## 2011-11-26 NOTE — Telephone Encounter (Signed)
Spoke with pt- he would like referral - order done - pt aware terri will contact once done

## 2011-11-26 NOTE — Telephone Encounter (Signed)
Spoke with pt about lab - low testosterone - pt is using androgel daily - instructed to double dose- pt inqiured about seeing a urologist since this has been a problem for quite sometime Please advise

## 2011-11-26 NOTE — Telephone Encounter (Signed)
A urologist is a Careers adviser which he does not need-  ask patient if he would like a referral to endocrinology

## 2011-12-18 ENCOUNTER — Other Ambulatory Visit (INDEPENDENT_AMBULATORY_CARE_PROVIDER_SITE_OTHER): Payer: BC Managed Care – PPO

## 2011-12-18 ENCOUNTER — Encounter: Payer: Self-pay | Admitting: Endocrinology

## 2011-12-18 ENCOUNTER — Ambulatory Visit (INDEPENDENT_AMBULATORY_CARE_PROVIDER_SITE_OTHER): Payer: BC Managed Care – PPO | Admitting: Endocrinology

## 2011-12-18 ENCOUNTER — Ambulatory Visit: Payer: Self-pay | Admitting: Endocrinology

## 2011-12-18 VITALS — BP 114/86 | HR 85 | Temp 97.0°F | Ht 68.0 in | Wt 183.0 lb

## 2011-12-18 DIAGNOSIS — E291 Testicular hypofunction: Secondary | ICD-10-CM

## 2011-12-18 LAB — PROLACTIN: Prolactin: 4.1 ng/mL (ref 2.1–17.1)

## 2011-12-18 NOTE — Progress Notes (Signed)
Subjective:    Patient ID: Sean Mejia, male    DOB: 09/07/1951, 61 y.o.   MRN: [id number]  HPI Pt states few years of slightly decreased muscle strength throughout the body, and assoc lack of libido.  For the past few weeks, he takes 4 actuations/day, of androgel.  The increase has not helped his sxs.  He has no children, by choice, although his wife has been pregnant once.  Past Medical History  Diagnosis Date  . Depression   . Hypertension     No past surgical history on file.  History   Social History  . Marital Status: Married    Spouse Name: N/A    Number of Children: N/A  . Years of Education: N/A   Occupational History  . Not on file.   Social History Main Topics  . Smoking status: Never Smoker   . Smokeless tobacco: Never Used  . Alcohol Use: Yes  . Drug Use: No  . Sexually Active: Not on file   Other Topics Concern  . Not on file   Social History Narrative  . No narrative on file    Current Outpatient Prescriptions on File Prior to Visit  Medication Sig Dispense Refill  . ABILIFY 5 MG tablet Take 5 mg by mouth daily.       Marland Kitchen BENICAR HCT 40-25 MG per tablet TAKE 1 TABLET BY MOUTH EVERY DAY  90 tablet  3  . simvastatin (ZOCOR) 40 MG tablet Take 1 tablet (40 mg total) by mouth at bedtime.  906 tablet  0  . Testosterone (ANDROGEL PUMP) 20.25 MG/ACT (1.62%) GEL Place 2 application onto the skin 1 day or 1 dose.  75 g  5  . DISCONTD: DULoxetine (CYMBALTA) 60 MG capsule Take 1 capsule (60 mg total) by mouth daily.  90 capsule  6  . DISCONTD: olmesartan-hydrochlorothiazide (BENICAR HCT) 40-25 MG per tablet Take 1 tablet by mouth daily.  90 tablet  6    Allergies  Allergen Reactions  . Amoxicillin     REACTION: unspecified  . Penicillins     REACTION: hives    Family History  Problem Relation Age of Onset  . COPD Mother   . Lupus Mother   . Heart attack Father   . Cancer Brother     bladder    BP 114/86  Pulse 85  Temp(Src) 97 F (36.1 C)  (Oral)  Ht 5\' 8"  (1.727 m)  Wt 183 lb (83.008 kg)  BMI 27.82 kg/m2  SpO2 97%    Review of Systems denies polyuria, numbness, erectile dysfunction, weight change, decreased urinary stream, gynecomastia, fever, headache, sob, rash, blurry vision, and chest pain.  He has chronic depression and fatigue.  He has slight myalgias and easy bruising.     Objective:   Physical Exam VS: see vs page GEN: no distress HEAD: head: no deformity eyes: no periorbital swelling, no proptosis external nose and ears are normal mouth: no lesion seen NECK: supple, thyroid is not enlarged CHEST WALL: no deformity LUNGS: clear to auscultation BREASTS:  No gynecomastia CV: reg rate and rhythm, no murmur. ABD: abdomen is soft, nontender.  no hepatosplenomegaly.  not distended.  no hernia. GENITALIA:  Normal male, except testes are slightly small and soft   MUSCULOSKELETAL: muscle bulk and strength are grossly normal.  no obvious joint swelling.  gait is normal and steady EXTEMITIES: no deformity.  no ulcer on the feet.  feet are of normal color and temp.  no  edema PULSES: dorsalis pedis intact bilat.  no carotid bruit NEURO:  cn 2-12 grossly intact.   readily moves all 4's.  sensation is intact to touch on the feet SKIN:  Normal texture and temperature.  No rash or suspicious lesion is visible.  Normal hair distribution. NODES:  None palpable at the neck PSYCH: alert, oriented x3.  Does not appear anxious nor depressed.  Lab Results  Component Value Date   TESTOSTERONE 198.40* 11/25/2011      Assessment & Plan:  Hypogonadism.  To determine whether this is primary or secondary would require withdrawal of supplementation for several months.  Pt declines this for now Depression.  This is unlikely to be related to hypogonadism, but pt wants to normalize testosterone level on a trial basis to see if this helps sxs.   Thrombocytosis.  This is unlikely to be affected by increasing the testosterone level.

## 2011-12-18 NOTE — Patient Instructions (Addendum)
It seems that the main question is; to what extent are your symptoms caused by the low testosterone? normalization of testosterone is not known to harm you.  however, there are "theoretical" risks, including increased or decreased fertility (depending on treatment type), hair loss, prostate cancer, benign prostate enlargement, blood clots, liver problems, lower hdl ("good cholesterol"), sleep apnea, and behavior changes.   blood tests are being requested for you today.  You will receive a letter with results.

## 2011-12-23 ENCOUNTER — Other Ambulatory Visit: Payer: Self-pay

## 2011-12-23 DIAGNOSIS — E291 Testicular hypofunction: Secondary | ICD-10-CM

## 2011-12-29 ENCOUNTER — Other Ambulatory Visit: Payer: Self-pay | Admitting: *Deleted

## 2011-12-29 MED ORDER — TESTOSTERONE 20.25 MG/ACT (1.62%) TD GEL: 5.0000 "application " | TRANSDERMAL | Status: DC

## 2011-12-29 NOTE — Telephone Encounter (Signed)
Rx faxed to [facility]-informed pt via VM rx sent to pharmacy and to callback office with any questions/concerns.

## 2011-12-29 NOTE — Telephone Encounter (Signed)
Pt called requesting refill of Androgel rx.

## 2011-12-29 NOTE — Telephone Encounter (Signed)
i printed 

## 2012-01-11 ENCOUNTER — Other Ambulatory Visit: Payer: Self-pay | Admitting: Internal Medicine

## 2012-03-19 ENCOUNTER — Other Ambulatory Visit: Payer: Self-pay | Admitting: Endocrinology

## 2012-03-19 ENCOUNTER — Other Ambulatory Visit: Payer: Self-pay | Admitting: *Deleted

## 2012-03-19 MED ORDER — TESTOSTERONE 20.25 MG/ACT (1.62%) TD GEL: 5.0000 "application " | TRANSDERMAL | Status: DC

## 2012-03-19 NOTE — Telephone Encounter (Signed)
Rx faxed to [facility].  

## 2012-03-19 NOTE — Telephone Encounter (Signed)
R'cd fax from [facility] for refill of Androgel-last written 12/29/2011 75g with 5 refills-please advise.

## 2012-04-17 ENCOUNTER — Other Ambulatory Visit: Payer: Self-pay | Admitting: Internal Medicine

## 2012-06-21 ENCOUNTER — Other Ambulatory Visit: Payer: Self-pay | Admitting: Internal Medicine

## 2012-08-13 ENCOUNTER — Other Ambulatory Visit: Payer: Self-pay | Admitting: Internal Medicine

## 2012-09-16 ENCOUNTER — Other Ambulatory Visit: Payer: Self-pay | Admitting: Internal Medicine

## 2012-09-18 ENCOUNTER — Other Ambulatory Visit: Payer: Self-pay | Admitting: Internal Medicine

## 2012-09-21 NOTE — Telephone Encounter (Signed)
Samples given by MD to last until next appt.

## 2012-09-21 NOTE — Telephone Encounter (Signed)
Pt needs refill on BENICAR HCT 40-25 MG per tablet.  Pt has appt on 09/23/12 at 8am. Would like to get refill now before appt because he is out.  [facility]

## 2012-09-23 ENCOUNTER — Encounter: Payer: Self-pay | Admitting: Internal Medicine

## 2012-09-23 ENCOUNTER — Ambulatory Visit (INDEPENDENT_AMBULATORY_CARE_PROVIDER_SITE_OTHER): Payer: BC Managed Care – PPO | Admitting: Internal Medicine

## 2012-09-23 VITALS — BP 140/90 | HR 87 | Temp 98.3°F | Resp 18 | Wt 190.0 lb

## 2012-09-23 DIAGNOSIS — E785 Hyperlipidemia, unspecified: Secondary | ICD-10-CM

## 2012-09-23 DIAGNOSIS — I10 Essential (primary) hypertension: Secondary | ICD-10-CM

## 2012-09-23 MED ORDER — OLMESARTAN MEDOXOMIL-HCTZ 40-25 MG PO TABS: 1.0000 | ORAL_TABLET | Freq: Every day | ORAL | Status: DC

## 2012-09-23 MED ORDER — DULOXETINE HCL 60 MG PO CPEP: 60.0000 mg | ORAL_CAPSULE | Freq: Two times a day (BID) | ORAL | Status: DC

## 2012-09-23 MED ORDER — SIMVASTATIN 40 MG PO TABS: 40.0000 mg | ORAL_TABLET | Freq: Every day | ORAL | Status: DC

## 2012-09-23 NOTE — Progress Notes (Signed)
Subjective:    Patient ID: Sean Mejia, male    DOB: 1951-06-16, 62 y.o.   MRN: [id number]  HPI  62 year old patient who is seen today for followup. Has not been seen here in about a year and a half. Medical issues include hypertension dyslipidemia and a history of hypogonadism. He is a well no concerns or complaints. He has a history depression which has been fairly stable  BP Readings from Last 3 Encounters:  09/23/12 140/90  12/18/11 114/86  03/31/11 100/60    Past Medical History  Diagnosis Date  . Depression   . Hypertension     History   Social History  . Marital Status: Married    Spouse Name: N/A    Number of Children: N/A  . Years of Education: N/A   Occupational History  . Not on file.   Social History Main Topics  . Smoking status: Never Smoker   . Smokeless tobacco: Never Used  . Alcohol Use: Yes  . Drug Use: No  . Sexually Active: Not on file   Other Topics Concern  . Not on file   Social History Narrative  . No narrative on file    No past surgical history on file.  Family History  Problem Relation Age of Onset  . COPD Mother   . Lupus Mother   . Heart attack Father   . Cancer Brother     bladder    Allergies  Allergen Reactions  . Amoxicillin     REACTION: unspecified  . Penicillins     REACTION: hives    Current Outpatient Prescriptions on File Prior to Visit  Medication Sig Dispense Refill  . BENICAR HCT 40-25 MG per tablet TAKE 1 TABLET BY MOUTH EVERY DAY  30 tablet  0  . DULoxetine (CYMBALTA) 60 MG capsule Take 60 mg by mouth 2 (two) times daily.      . simvastatin (ZOCOR) 40 MG tablet Take 1 tablet (40 mg total) by mouth at bedtime.  906 tablet  0  . simvastatin (ZOCOR) 40 MG tablet TAKE 1 TABLET BY MOUTH EVERY DAY  60 tablet  3  . ABILIFY 5 MG tablet Take 5 mg by mouth daily.       . Testosterone 20.25 MG/ACT (1.62%) GEL Place 5 application onto the skin 1 day or 1 dose.  150 g  5    BP 140/90  Pulse 87  Temp 98.3 F  (36.8 C) (Oral)  Resp 18  Wt 190 lb (86.183 kg)  SpO2 96%    Review of Systems  Constitutional: Negative for fever, chills, appetite change and fatigue.  HENT: Negative for hearing loss, ear pain, congestion, sore throat, trouble swallowing, neck stiffness, dental problem, voice change and tinnitus.   Eyes: Negative for pain, discharge and visual disturbance.  Respiratory: Negative for cough, chest tightness, wheezing and stridor.   Cardiovascular: Negative for chest pain, palpitations and leg swelling.  Gastrointestinal: Negative for nausea, vomiting, abdominal pain, diarrhea, constipation, blood in stool and abdominal distention.  Genitourinary: Negative for urgency, hematuria, flank pain, discharge, difficulty urinating and genital sores.  Musculoskeletal: Negative for myalgias, back pain, joint swelling, arthralgias and gait problem.  Skin: Negative for rash.  Neurological: Negative for dizziness, syncope, speech difficulty, weakness, numbness and headaches.  Hematological: Negative for adenopathy. Does not bruise/bleed easily.  Psychiatric/Behavioral: Negative for behavioral problems and dysphoric mood. The patient is not nervous/anxious.        Objective:   Physical Exam  Constitutional: He is oriented to person, place, and time. He appears well-developed.       Repeat blood pressure 110/80  HENT:  Head: Normocephalic.  Right Ear: External ear normal.  Left Ear: External ear normal.  Eyes: Conjunctivae normal and EOM are normal.  Neck: Normal range of motion.  Cardiovascular: Normal rate and normal heart sounds.   Pulmonary/Chest: Breath sounds normal.  Abdominal: Bowel sounds are normal.  Musculoskeletal: Normal range of motion. He exhibits no edema and no tenderness.  Neurological: He is alert and oriented to person, place, and time.  Psychiatric: He has a normal mood and affect. His behavior is normal.          Assessment & Plan:    Hypertension  controlled Depression Dyslipidemia  Will schedule CPX in 6 months. Followup colonoscopy at that time

## 2012-09-23 NOTE — Patient Instructions (Signed)
Limit your sodium (Salt) intake    It is important that you exercise regularly, at least 20 minutes 3 to 4 times per week.  If you develop chest pain or shortness of breath seek  medical attention.  Return in 6 months for follow-up  

## 2012-12-14 ENCOUNTER — Ambulatory Visit: Payer: Self-pay | Admitting: Internal Medicine

## 2013-01-27 ENCOUNTER — Other Ambulatory Visit: Payer: Self-pay | Admitting: Internal Medicine

## 2013-02-12 ENCOUNTER — Other Ambulatory Visit: Payer: Self-pay | Admitting: Internal Medicine

## 2013-07-14 ENCOUNTER — Encounter: Payer: Self-pay | Admitting: Internal Medicine

## 2013-07-14 ENCOUNTER — Ambulatory Visit (INDEPENDENT_AMBULATORY_CARE_PROVIDER_SITE_OTHER): Payer: BC Managed Care – PPO | Admitting: Internal Medicine

## 2013-07-14 VITALS — BP 130/90 | HR 70 | Temp 98.3°F | Resp 20 | Ht 68.25 in | Wt 189.0 lb

## 2013-07-14 DIAGNOSIS — I10 Essential (primary) hypertension: Secondary | ICD-10-CM

## 2013-07-14 DIAGNOSIS — E291 Testicular hypofunction: Secondary | ICD-10-CM

## 2013-07-14 DIAGNOSIS — E785 Hyperlipidemia, unspecified: Secondary | ICD-10-CM

## 2013-07-14 DIAGNOSIS — Z0000 Encounter for general adult medical examination without abnormal findings: Secondary | ICD-10-CM

## 2013-07-14 LAB — CBC WITH DIFFERENTIAL/PLATELET
Basophils Absolute: 0.1 10*3/uL (ref 0.0–0.1)
Basophils Relative: 0.9 % (ref 0.0–3.0)
Eosinophils Absolute: 0.1 10*3/uL (ref 0.0–0.7)
Hemoglobin: 15 g/dL (ref 13.0–17.0)
MCHC: 34 g/dL (ref 30.0–36.0)
MCV: 93.5 fl (ref 78.0–100.0)
Monocytes Absolute: 0.6 10*3/uL (ref 0.1–1.0)
Neutro Abs: 3.4 10*3/uL (ref 1.4–7.7)
Neutrophils Relative %: 58.3 % (ref 43.0–77.0)
RBC: 4.72 Mil/uL (ref 4.22–5.81)
RDW: 13.1 % (ref 11.5–14.6)

## 2013-07-14 LAB — LIPID PANEL
Cholesterol: 163 mg/dL (ref 0–200)
LDL Cholesterol: 87 mg/dL (ref 0–99)
Total CHOL/HDL Ratio: 3
Triglycerides: 92 mg/dL (ref 0.0–149.0)
VLDL: 18.4 mg/dL (ref 0.0–40.0)

## 2013-07-14 LAB — COMPREHENSIVE METABOLIC PANEL
ALT: 37 U/L (ref 0–53)
AST: 33 U/L (ref 0–37)
Albumin: 4.3 g/dL (ref 3.5–5.2)
Alkaline Phosphatase: 59 U/L (ref 39–117)
BUN: 17 mg/dL (ref 6–23)
CO2: 28 mEq/L (ref 19–32)
Chloride: 98 mEq/L (ref 96–112)
Creatinine, Ser: 1.2 mg/dL (ref 0.4–1.5)
Potassium: 4.7 mEq/L (ref 3.5–5.1)
Sodium: 136 mEq/L (ref 135–145)

## 2013-07-14 NOTE — Progress Notes (Signed)
Subjective:    Patient ID: Sean Mejia, male    DOB: 24-Jan-1951, 62 y.o.   MRN: [id number]  HPI  62 year old patient who has treated hypertension history depression and dyslipidemia who is seen today for a preventive health examination. He also has a history of testosterone deficiency and has been on replacement therapy in the past. He did have screening colonoscopy 10 years ago. No new concerns or complaints  Family history Brother recently died of liver cancer and a prior history of bladder cancer hepatitis C and alcoholic cirrhosis  Current Allergies:  AMOXICILLIN (AMOXICILLIN)   Past Medical History:   Depression  Hypertension   Past Surgical History:   Denies surgical history  colonoscopy age 86   Family History:  father deceased from MS age 51- of acute MI  mother deceased from copd age 60; history of SLE  Four brothers, one with a history of bladder cancer    Past Medical History  Diagnosis Date  . Depression   . Hypertension     History   Social History  . Marital Status: Married    Spouse Name: N/A    Number of Children: N/A  . Years of Education: N/A   Occupational History  . Not on file.   Social History Main Topics  . Smoking status: Never Smoker   . Smokeless tobacco: Never Used  . Alcohol Use: Yes  . Drug Use: No  . Sexual Activity: Not on file   Other Topics Concern  . Not on file   Social History Narrative  . No narrative on file    History reviewed. No pertinent past surgical history.  Family History  Problem Relation Age of Onset  . COPD Mother   . Lupus Mother   . Heart attack Father   . Cancer Brother     bladder    Allergies  Allergen Reactions  . Amoxicillin     REACTION: unspecified  . Penicillins     REACTION: hives    Current Outpatient Prescriptions on File Prior to Visit  Medication Sig Dispense Refill  . DULoxetine (CYMBALTA) 60 MG capsule Take 1 capsule (60 mg total) by mouth 2 (two) times daily.  90  capsule  6  . olmesartan-hydrochlorothiazide (BENICAR HCT) 40-25 MG per tablet Take 1 tablet by mouth daily.  90 tablet  6  . simvastatin (ZOCOR) 40 MG tablet Take 1 tablet (40 mg total) by mouth at bedtime.  90 tablet  6   No current facility-administered medications on file prior to visit.    BP 130/90  Pulse 70  Temp(Src) 98.3 F (36.8 C) (Oral)  Resp 20  Ht 5' 8.25" (1.734 m)  Wt 189 lb (85.73 kg)  BMI 28.51 kg/m2  SpO2 98%     Review of Systems  Constitutional: Negative for fever, chills, appetite change and fatigue.  HENT: Negative for congestion, dental problem, ear pain, hearing loss, sore throat, tinnitus, trouble swallowing and voice change.   Eyes: Negative for pain, discharge and visual disturbance.  Respiratory: Negative for cough, chest tightness, wheezing and stridor.   Cardiovascular: Negative for chest pain, palpitations and leg swelling.  Gastrointestinal: Negative for nausea, vomiting, abdominal pain, diarrhea, constipation, blood in stool and abdominal distention.  Genitourinary: Negative for urgency, hematuria, flank pain, discharge, difficulty urinating and genital sores.  Musculoskeletal: Negative for arthralgias, back pain, gait problem, joint swelling, myalgias and neck stiffness.  Skin: Negative for rash.  Neurological: Negative for dizziness, syncope, speech difficulty, weakness, numbness  and headaches.  Hematological: Negative for adenopathy. Does not bruise/bleed easily.  Psychiatric/Behavioral: Negative for behavioral problems and dysphoric mood. The patient is not nervous/anxious.        Objective:   Physical Exam  Constitutional: He is oriented to person, place, and time. He appears well-developed and well-nourished.  HENT:  Head: Normocephalic and atraumatic.  Right Ear: External ear normal.  Left Ear: External ear normal.  Nose: Nose normal.  Mouth/Throat: Oropharynx is clear and moist.  Eyes: Conjunctivae and EOM are normal. Pupils are  equal, round, and reactive to light. No scleral icterus.  Neck: Normal range of motion. Neck supple. No JVD present. No thyromegaly present.  Cardiovascular: Normal rate, regular rhythm, normal heart sounds and intact distal pulses.  Exam reveals no gallop and no friction rub.   No murmur heard. Pulmonary/Chest: Effort normal and breath sounds normal. He exhibits no tenderness.  Abdominal: Soft. Bowel sounds are normal. He exhibits no distension and no mass. There is no tenderness.  Genitourinary: Penis normal.  Prostate minimally symmetrically enlarged  Musculoskeletal: Normal range of motion. He exhibits no edema and no tenderness.  Lymphadenopathy:    He has no cervical adenopathy.  Neurological: He is alert and oriented to person, place, and time. He has normal reflexes. No cranial nerve deficit. Coordination normal.  Skin: Skin is warm and dry. No rash noted.  Psychiatric: He has a normal mood and affect. His behavior is normal.          Assessment & Plan:  Preventive health examination Hypertension stable Dyslipidemia. Continue simvastatin. We'll check a lipid profile Testosterone deficiency. Patient does not desire to resume therapy. Bone density recommended at age 14. Patient is aware  We'll continue home blood pressure monitoring Recheck one year

## 2013-07-14 NOTE — Patient Instructions (Signed)
Limit your sodium (Salt) intake  Please check your blood pressure on a regular basis.  If it is consistently greater than 150/90, please make an office appointment.  Return in one year for follow-up    It is important that you exercise regularly, at least 20 minutes 3 to 4 times per week.  If you develop chest pain or shortness of breath seek  medical attention.  

## 2013-07-15 LAB — HEPATITIS C ANTIBODY: HCV Ab: NEGATIVE

## 2013-08-29 ENCOUNTER — Encounter: Payer: Self-pay | Admitting: Internal Medicine

## 2013-09-23 ENCOUNTER — Ambulatory Visit (AMBULATORY_SURGERY_CENTER): Payer: Self-pay | Admitting: *Deleted

## 2013-09-23 VITALS — Ht 69.0 in | Wt 192.0 lb

## 2013-09-23 DIAGNOSIS — Z1211 Encounter for screening for malignant neoplasm of colon: Secondary | ICD-10-CM

## 2013-09-23 MED ORDER — PEG-KCL-NACL-NASULF-NA ASC-C 100 G PO SOLR: ORAL | Status: DC

## 2013-09-23 NOTE — Progress Notes (Signed)
No egg or soy allergy  [place]

## 2013-10-04 ENCOUNTER — Other Ambulatory Visit: Payer: Self-pay | Admitting: Internal Medicine

## 2013-10-06 ENCOUNTER — Ambulatory Visit (AMBULATORY_SURGERY_CENTER): Payer: 59 | Admitting: Internal Medicine

## 2013-10-06 ENCOUNTER — Encounter: Payer: Self-pay | Admitting: Internal Medicine

## 2013-10-06 VITALS — BP 125/84 | HR 76 | Temp 97.8°F | Resp 13 | Ht 69.0 in | Wt 192.0 lb

## 2013-10-06 DIAGNOSIS — Z1211 Encounter for screening for malignant neoplasm of colon: Secondary | ICD-10-CM

## 2013-10-06 MED ORDER — SODIUM CHLORIDE 0.9 % IV SOLN: 500.0000 mL | INTRAVENOUS | Status: DC

## 2013-10-06 NOTE — Patient Instructions (Signed)
YOU HAD AN ENDOSCOPIC PROCEDURE TODAY AT THE [facility]: Refer to the procedure report that was given to you for any specific questions about what was found during the examination.  If the procedure report does not answer your questions, please call your gastroenterologist to clarify.  If you requested that your care partner not be given the details of your procedure findings, then the procedure report has been included in a sealed envelope for you to review at your convenience later.  YOU SHOULD EXPECT: Some feelings of bloating in the abdomen. Passage of more gas than usual.  Walking can help get rid of the air that was put into your GI tract during the procedure and reduce the bloating. If you had a lower endoscopy (such as a colonoscopy or flexible sigmoidoscopy) you may notice spotting of blood in your stool or on the toilet paper. If you underwent a bowel prep for your procedure, then you may not have a normal bowel movement for a few days.  DIET: Your first meal following the procedure should be a light meal and then it is ok to progress to your normal diet.  A half-sandwich or bowl of soup is an example of a good first meal.  Heavy or fried foods are harder to digest and may make you feel nauseous or bloated.  Likewise meals heavy in dairy and vegetables can cause extra gas to form and this can also increase the bloating.  Drink plenty of fluids but you should avoid alcoholic beverages for 24 hours.  ACTIVITY: Your care partner should take you home directly after the procedure.  You should plan to take it easy, moving slowly for the rest of the day.  You can resume normal activity the day after the procedure however you should NOT DRIVE or use heavy machinery for 24 hours (because of the sedation medicines used during the test).    SYMPTOMS TO REPORT IMMEDIATELY: A gastroenterologist can be reached at any hour.  During normal business hours, 8:30 AM to 5:00 PM Monday through Friday,  call [phone].  After hours and on weekends, please call the GI answering service at [phone] who will take a message and have the physician on call contact you.   Following lower endoscopy (colonoscopy or flexible sigmoidoscopy):  Excessive amounts of blood in the stool  Significant tenderness or worsening of abdominal pains  Swelling of the abdomen that is new, acute  Fever of 100F or higher    FOLLOW UP: If any biopsies were taken you will be contacted by phone or by letter within the next 1-3 weeks.  Call your gastroenterologist if you have not heard about the biopsies in 3 weeks.  Our staff will call the home number listed on your records the next business day following your procedure to check on you and address any questions or concerns that you may have at that time regarding the information given to you following your procedure. This is a courtesy call and so if there is no answer at the home number and we have not heard from you through the emergency physician on call, we will assume that you have returned to your regular daily activities without incident.  SIGNATURES/CONFIDENTIALITY: You and/or your care partner have signed paperwork which will be entered into your electronic medical record.  These signatures attest to the fact that that the information above on your After Visit Summary has been reviewed and is understood.  Full responsibility of the confidentiality   of this discharge information lies with you and/or your care-partner.     

## 2013-10-06 NOTE — Op Note (Signed)
[facility] [address] [place], [id number]   COLONOSCOPY PROCEDURE REPORT  PATIENT: Sean BachDeville, [name]  MR#: [id number] BIRTHDATE: 1951/01/25 , 62  yrs. old GENDER: Male ENDOSCOPIST: Beverley FiedlerJay M Pyrtle, MD REFERRED XB:JYNWGBY:Peter Amador CunasKwiatkowski, M.D. PROCEDURE DATE:  10/06/2013 PROCEDURE:   Colonoscopy, screening First Screening Colonoscopy - Avg.  risk and is 50 yrs.  old or older - No.  Prior Negative Screening - Now for repeat screening. 10 or more years since last screening  History of Adenoma - Now for follow-up colonoscopy & has been > or = to 3 yrs.  N/A  Polyps Removed Today? No.  Recommend repeat exam, <10 yrs? No. ASA CLASS:   Class II INDICATIONS:average risk screening and Last colonoscopy performed 10 years ago. MEDICATIONS: MAC sedation, administered by CRNA and propofol (Diprivan) 250mg  IV  DESCRIPTION OF PROCEDURE:   After the risks benefits and alternatives of the procedure were thoroughly explained, informed consent was obtained.  A digital rectal exam revealed no rectal mass.   The LB [id number]  endoscope was introduced through the anus and advanced to the cecum, which was identified by both the appendix and ileocecal valve. No adverse events experienced. The quality of the prep was good, using MoviPrep  The instrument was then slowly withdrawn as the colon was fully examined.   COLON FINDINGS: Mild diverticulosis was noted in the ascending colon and sigmoid colon.   The colon was otherwise normal.  There was no inflammation, polyps or cancers seen.  Retroflexed views revealed no abnormalities. The time to cecum=2 minutes 34 seconds. Withdrawal time=9 minutes 36 seconds.  The scope was withdrawn and the procedure completed.  COMPLICATIONS: There were no complications.  ENDOSCOPIC IMPRESSION: 1.   Mild diverticulosis was noted in the ascending colon and sigmoid colon 2.   The colon was otherwise normal  RECOMMENDATIONS: 1.  High fiber  diet 2.  You should continue to follow colorectal cancer screening guidelines for "routine risk" patients with a repeat colonoscopy in 10 years.  There is no need for FOBT (stool) testing for at least 5 years.   eSigned:  Beverley FiedlerJay M Pyrtle, MD 10/06/2013 8:43 AM   cc: The Patient and Gordy SaversPeter F Kwiatkowski, MD

## 2013-10-07 ENCOUNTER — Telehealth: Payer: Self-pay | Admitting: *Deleted

## 2013-10-07 NOTE — Telephone Encounter (Signed)
Lm on vm of number given in admitting yesterday to return call if questions, concerns or problems. ewm

## 2013-10-24 ENCOUNTER — Encounter: Payer: Self-pay | Admitting: Family Medicine

## 2013-10-24 ENCOUNTER — Ambulatory Visit (INDEPENDENT_AMBULATORY_CARE_PROVIDER_SITE_OTHER): Payer: 59 | Admitting: Family Medicine

## 2013-10-24 VITALS — BP 120/76 | HR 102 | Temp 98.3°F | Wt 191.0 lb

## 2013-10-24 DIAGNOSIS — J111 Influenza due to unidentified influenza virus with other respiratory manifestations: Secondary | ICD-10-CM

## 2013-10-24 MED ORDER — OSELTAMIVIR PHOSPHATE 75 MG PO CAPS: 75.0000 mg | ORAL_CAPSULE | Freq: Two times a day (BID) | ORAL | Status: DC

## 2013-10-24 NOTE — Progress Notes (Signed)
Pre visit review using our clinic review tool, if applicable. No additional management support is needed unless otherwise documented below in the visit note. 

## 2013-10-24 NOTE — Progress Notes (Signed)
   Subjective:    Patient ID: Sean Mejia, male    DOB: Jan 29, 1951, 63 y.o.   MRN: [id number]  HPI Acute visit. Patient developed febrile illness yesterday. He had cough, headaches, body aches, chills, sore throat. Intermittent headaches. Denies any nausea or vomiting. He's taken Advil with some symptom relief. Denies any nausea or vomiting. No skin rashes. No sick contacts.  Past Medical History  Diagnosis Date  . Depression   . Hypertension   . GERD (gastroesophageal reflux disease)     no medications taken for this  . Hyperlipidemia    Past Surgical History  Procedure Laterality Date  . Eye surgery      both eyes    reports that he has never smoked. He has never used smokeless tobacco. He reports that he drinks alcohol. He reports that he does not use illicit drugs. family history includes COPD in his mother; Cancer in his brother; Heart attack in his father; Lupus in his mother. There is no history of Colon cancer, Esophageal cancer, Stomach cancer, or Rectal cancer. Allergies  Allergen Reactions  . Amoxicillin     REACTION: unspecified  . Penicillins     REACTION: hives      Review of Systems  Constitutional: Positive for fever, chills and fatigue.  HENT: Positive for sore throat.   Respiratory: Positive for cough.   Gastrointestinal: Negative for nausea and vomiting.  Neurological: Positive for headaches.       Objective:   Physical Exam  Constitutional: He appears well-developed and well-nourished.  HENT:  Right Ear: External ear normal.  Left Ear: External ear normal.  Mouth/Throat: Oropharynx is clear and moist.  Neck: Neck supple.  Cardiovascular: Normal rate.   Pulmonary/Chest: Effort normal and breath sounds normal. No respiratory distress. He has no wheezes. He has no rales.  Musculoskeletal: He exhibits no edema.  Lymphadenopathy:    He has no cervical adenopathy.  Skin: No rash noted.          Assessment & Plan:  Acute viral syndrome.  Suspect probable influenza. Tamiflu 75 mg twice daily for 5 days. Otherwise treat symptomatically. Followup as needed if symptoms worsen.

## 2013-10-24 NOTE — Patient Instructions (Signed)

## 2013-11-01 ENCOUNTER — Ambulatory Visit (INDEPENDENT_AMBULATORY_CARE_PROVIDER_SITE_OTHER): Payer: 59 | Admitting: Internal Medicine

## 2013-11-01 ENCOUNTER — Encounter: Payer: Self-pay | Admitting: Internal Medicine

## 2013-11-01 VITALS — BP 130/90 | HR 75 | Temp 97.8°F | Resp 20 | Ht 69.0 in | Wt 190.0 lb

## 2013-11-01 DIAGNOSIS — E785 Hyperlipidemia, unspecified: Secondary | ICD-10-CM

## 2013-11-01 DIAGNOSIS — I10 Essential (primary) hypertension: Secondary | ICD-10-CM

## 2013-11-01 DIAGNOSIS — J069 Acute upper respiratory infection, unspecified: Secondary | ICD-10-CM

## 2013-11-01 MED ORDER — HYDROCODONE-HOMATROPINE 5-1.5 MG/5ML PO SYRP: 5.0000 mL | ORAL_SOLUTION | Freq: Four times a day (QID) | ORAL | Status: DC | PRN

## 2013-11-01 NOTE — Progress Notes (Signed)
Pre-visit discussion using our clinic review tool. No additional management support is needed unless otherwise documented below in the visit note.  

## 2013-11-01 NOTE — Progress Notes (Signed)
Subjective:    Patient ID: Sean Mejia, male    DOB: 1951-09-09, 63 y.o.   MRN: [id number]  HPI  63 year old patient who has treated hypertension. He was seen 8 days ago for URI and treated symptomatically. He is much improved and generally feels well except for a cough which is much worse during the night. Rhinorrhea has resolved and there is been no fever wheezing or shortness of breath. There is been no productive cough.  Past Medical History  Diagnosis Date  . Depression   . Hypertension   . GERD (gastroesophageal reflux disease)     no medications taken for this  . Hyperlipidemia     History   Social History  . Marital Status: Married    Spouse Name: N/A    Number of Children: N/A  . Years of Education: N/A   Occupational History  . Not on file.   Social History Main Topics  . Smoking status: Never Smoker   . Smokeless tobacco: Never Used  . Alcohol Use: Yes     Comment: 3 times a week  . Drug Use: No  . Sexual Activity: Not on file   Other Topics Concern  . Not on file   Social History Narrative  . No narrative on file    Past Surgical History  Procedure Laterality Date  . Eye surgery      both eyes    Family History  Problem Relation Age of Onset  . COPD Mother   . Lupus Mother   . Heart attack Father   . Cancer Brother     bladder and liver  . Colon cancer Neg Hx   . Esophageal cancer Neg Hx   . Stomach cancer Neg Hx   . Rectal cancer Neg Hx     Allergies  Allergen Reactions  . Amoxicillin     REACTION: unspecified  . Penicillins     REACTION: hives    Current Outpatient Prescriptions on File Prior to Visit  Medication Sig Dispense Refill  . BENICAR HCT 40-25 MG per tablet TAKE 1 TABLET BY MOUTH DAILY.  90 tablet  1  . DULoxetine (CYMBALTA) 60 MG capsule Take 1 capsule (60 mg total) by mouth 2 (two) times daily.  90 capsule  6  . Multiple Vitamins-Minerals (MULTIVITAMIN PO) Take by mouth daily.      . simvastatin (ZOCOR) 40 MG  tablet Take 1 tablet (40 mg total) by mouth at bedtime.  90 tablet  6   No current facility-administered medications on file prior to visit.    BP 130/90  Pulse 75  Temp(Src) 97.8 F (36.6 C) (Oral)  Resp 20  Ht 5\' 9"  (1.753 m)  Wt 190 lb (86.183 kg)  BMI 28.05 kg/m2  SpO2 96%       Review of Systems  Constitutional: Negative for fever, chills, appetite change and fatigue.  HENT: Negative for congestion, dental problem, ear pain, hearing loss, sore throat, tinnitus, trouble swallowing and voice change.   Eyes: Negative for pain, discharge and visual disturbance.  Respiratory: Positive for cough. Negative for chest tightness, wheezing and stridor.   Cardiovascular: Negative for chest pain, palpitations and leg swelling.  Gastrointestinal: Negative for nausea, vomiting, abdominal pain, diarrhea, constipation, blood in stool and abdominal distention.  Genitourinary: Negative for urgency, hematuria, flank pain, discharge, difficulty urinating and genital sores.  Musculoskeletal: Negative for arthralgias, back pain, gait problem, joint swelling, myalgias and neck stiffness.  Skin: Negative for rash.  Neurological: Negative for dizziness, syncope, speech difficulty, weakness, numbness and headaches.  Hematological: Negative for adenopathy. Does not bruise/bleed easily.  Psychiatric/Behavioral: Negative for behavioral problems and dysphoric mood. The patient is not nervous/anxious.        Objective:   Physical Exam  Constitutional: He is oriented to person, place, and time. He appears well-developed.  HENT:  Head: Normocephalic.  Right Ear: External ear normal.  Left Ear: External ear normal.  Eyes: Conjunctivae and EOM are normal.  Neck: Normal range of motion.  Cardiovascular: Normal rate and normal heart sounds.   Pulmonary/Chest: Effort normal and breath sounds normal. No respiratory distress. He has no wheezes. He has no rales.  Abdominal: Bowel sounds are normal.    Musculoskeletal: Normal range of motion. He exhibits no edema and no tenderness.  Neurological: He is alert and oriented to person, place, and time.  Psychiatric: He has a normal mood and affect. His behavior is normal.          Assessment & Plan:  Viral URI with cough.Maryclare Labrador. We'll continue symptomatic treatment Hypertension well controlled  CPX one year

## 2013-11-01 NOTE — Patient Instructions (Signed)
Acute bronchitis symptoms are generally not helped by antibiotics.  Take over-the-counter expectorants and cough medications such as  Mucinex DM.  Call if there is no improvement in 5 to 7 days or if he developed worsening cough, fever, or new symptoms, such as shortness of breath or chest pain. 

## 2013-11-02 ENCOUNTER — Telehealth: Payer: Self-pay | Admitting: Internal Medicine

## 2013-11-02 NOTE — Telephone Encounter (Signed)
Relevant patient education assigned to patient using Emmi. ° °

## 2013-11-26 ENCOUNTER — Other Ambulatory Visit: Payer: Self-pay | Admitting: Internal Medicine

## 2014-01-08 ENCOUNTER — Other Ambulatory Visit: Payer: Self-pay | Admitting: Orthopedic Surgery

## 2014-03-30 ENCOUNTER — Other Ambulatory Visit: Payer: Self-pay | Admitting: Internal Medicine

## 2014-05-31 ENCOUNTER — Encounter: Payer: Self-pay | Admitting: Internal Medicine

## 2014-05-31 ENCOUNTER — Ambulatory Visit (INDEPENDENT_AMBULATORY_CARE_PROVIDER_SITE_OTHER): Payer: 59 | Admitting: Internal Medicine

## 2014-05-31 VITALS — BP 140/98 | HR 80 | Temp 98.3°F | Wt 191.0 lb

## 2014-05-31 DIAGNOSIS — I10 Essential (primary) hypertension: Secondary | ICD-10-CM

## 2014-05-31 DIAGNOSIS — K219 Gastro-esophageal reflux disease without esophagitis: Secondary | ICD-10-CM

## 2014-05-31 MED ORDER — OMEPRAZOLE 40 MG PO CPDR: 40.0000 mg | DELAYED_RELEASE_CAPSULE | Freq: Every day | ORAL | Status: DC

## 2014-05-31 NOTE — Patient Instructions (Addendum)
Food Choices for Gastroesophageal Reflux Disease When you have gastroesophageal reflux disease (GERD), the foods you eat and your eating habits are very important. Choosing the right foods can help ease the discomfort of GERD. WHAT GENERAL GUIDELINES DO I NEED TO FOLLOW?  Choose fruits, vegetables, whole grains, low-fat dairy products, and low-fat meat, fish, and poultry.  Limit fats such as oils, salad dressings, butter, nuts, and avocado.  Keep a food diary to identify foods that cause symptoms.  Avoid foods that cause reflux. These may be different for different people.  Eat frequent small meals instead of three large meals each day.  Eat your meals slowly, in a relaxed setting.  Limit fried foods.  Cook foods using methods other than frying.  Avoid drinking alcohol.  Avoid drinking large amounts of liquids with your meals.  Avoid bending over or lying down until 2-3 hours after eating. WHAT FOODS ARE NOT RECOMMENDED? The following are some foods and drinks that may worsen your symptoms: Vegetables Tomatoes. Tomato juice. Tomato and spaghetti sauce. Chili peppers. Onion and garlic. Horseradish. Fruits Oranges, grapefruit, and lemon (fruit and juice). Meats High-fat meats, fish, and poultry. This includes hot dogs, ribs, ham, sausage, salami, and bacon. Dairy Whole milk and chocolate milk. Sour cream. Cream. Butter. Ice cream. Cream cheese.  Beverages Coffee and tea, with or without caffeine. Carbonated beverages or energy drinks. Condiments Hot sauce. Barbecue sauce.  Sweets/Desserts Chocolate and cocoa. Donuts. Peppermint and spearmint. Fats and Oils High-fat foods, including French fries and potato chips. Other Vinegar. Strong spices, such as black pepper, white pepper, red pepper, cayenne, curry powder, cloves, ginger, and chili powder. The items listed above may not be a complete list of foods and beverages to avoid. Contact your dietitian for more  information. Document Released: 09/08/2005 Document Revised: 09/13/2013 Document Reviewed: 07/13/2013 [employer] Patient Information 2015 [employer]. This information is not intended to replace advice given to you by your health care provider. Make sure you discuss any questions you have with your health care provider. Gastroesophageal Reflux Disease, Adult Gastroesophageal reflux disease (GERD) happens when acid from your stomach flows up into the esophagus. When acid comes in contact with the esophagus, the acid causes soreness (inflammation) in the esophagus. Over time, GERD may create small holes (ulcers) in the lining of the esophagus. CAUSES   Increased body weight. This puts pressure on the stomach, making acid rise from the stomach into the esophagus.  Smoking. This increases acid production in the stomach.  Drinking alcohol. This causes decreased pressure in the lower esophageal sphincter (valve or ring of muscle between the esophagus and stomach), allowing acid from the stomach into the esophagus.  Late evening meals and a full stomach. This increases pressure and acid production in the stomach.  A malformed lower esophageal sphincter. Sometimes, no cause is found. SYMPTOMS   Burning pain in the lower part of the mid-chest behind the breastbone and in the mid-stomach area. This may occur twice a week or more often.  Trouble swallowing.  Sore throat.  Dry cough.  Asthma-like symptoms including chest tightness, shortness of breath, or wheezing. DIAGNOSIS  Your caregiver may be able to diagnose GERD based on your symptoms. In some cases, X-rays and other tests may be done to check for complications or to check the condition of your stomach and esophagus. TREATMENT  Your caregiver may recommend over-the-counter or prescription medicines to help decrease acid production. Ask your caregiver before starting or adding any new medicines.  HOME CARE   INSTRUCTIONS   Change the  factors that you can control. Ask your caregiver for guidance concerning weight loss, quitting smoking, and alcohol consumption.  Avoid foods and drinks that make your symptoms worse, such as:  Caffeine or alcoholic drinks.  Chocolate.  Peppermint or mint flavorings.  Garlic and onions.  Spicy foods.  Citrus fruits, such as oranges, lemons, or limes.  Tomato-based foods such as sauce, chili, salsa, and pizza.  Fried and fatty foods.  Avoid lying down for the 3 hours prior to your bedtime or prior to taking a nap.  Eat small, frequent meals instead of large meals.  Wear loose-fitting clothing. Do not wear anything tight around your waist that causes pressure on your stomach.  Raise the head of your bed 6 to 8 inches with wood blocks to help you sleep. Extra pillows will not help.  Only take over-the-counter or prescription medicines for pain, discomfort, or fever as directed by your caregiver.  Do not take aspirin, ibuprofen, or other nonsteroidal anti-inflammatory drugs (NSAIDs). SEEK IMMEDIATE MEDICAL CARE IF:   You have pain in your arms, neck, jaw, teeth, or back.  Your pain increases or changes in intensity or duration.  You develop nausea, vomiting, or sweating (diaphoresis).  You develop shortness of breath, or you faint.  Your vomit is green, yellow, black, or looks like coffee grounds or blood.  Your stool is red, bloody, or black. These symptoms could be signs of other problems, such as heart disease, gastric bleeding, or esophageal bleeding. MAKE SURE YOU:   Understand these instructions.  Will watch your condition.  Will get help right away if you are not doing well or get worse. Document Released: 06/18/2005 Document Revised: 12/01/2011 Document Reviewed: 03/28/2011 [facility] Patient Information 2015 [place], [place]. This information is not intended to replace advice given to you by your health care provider. Make sure you discuss any questions you have  with your health care provider.   Limit your sodium (Salt) intake  Please check your blood pressure on a regular basis.  If it is consistently greater than 150/90, please make an office appointment.

## 2014-05-31 NOTE — Progress Notes (Signed)
Subjective:    Patient ID: Sean Mejia, male    DOB: 05-Jul-1951, 63 y.o.   MRN: [id number]  HPI  BP Readings from Last 3 Encounters:  05/31/14 140/98  11/01/13 130/90  10/24/13 67/67   63 year old patient who has a history of treated hypertension.  He presents with really a two-year history of intermittent heartburn.  He has used OTC omeprazole, with prompt clinical improvement, but after discontinuation of therapy has recurrent symptoms.  He describes a burning substernal chest pain that is worse at night. He has treated hypertension and has been compliant with his medications.  No recent home blood pressure monitoring   Past Medical History  Diagnosis Date  . Depression   . Hypertension   . GERD (gastroesophageal reflux disease)     no medications taken for this  . Hyperlipidemia     History   Social History  . Marital Status: Married    Spouse Name: N/A    Number of Children: N/A  . Years of Education: N/A   Occupational History  . Not on file.   Social History Main Topics  . Smoking status: Never Smoker   . Smokeless tobacco: Never Used  . Alcohol Use: Yes     Comment: 3 times a week  . Drug Use: No  . Sexual Activity: Not on file   Other Topics Concern  . Not on file   Social History Narrative  . No narrative on file    Past Surgical History  Procedure Laterality Date  . Eye surgery      both eyes    Family History  Problem Relation Age of Onset  . COPD Mother   . Lupus Mother   . Heart attack Father   . Cancer Brother     bladder and liver  . Colon cancer Neg Hx   . Esophageal cancer Neg Hx   . Stomach cancer Neg Hx   . Rectal cancer Neg Hx     Allergies  Allergen Reactions  . Amoxicillin     REACTION: unspecified  . Penicillins     REACTION: hives    Current Outpatient Prescriptions on File Prior to Visit  Medication Sig Dispense Refill  . BENICAR HCT 40-25 MG per tablet TAKE 1 TABLET BY MOUTH DAILY.  90 tablet  1  .  DULoxetine (CYMBALTA) 60 MG capsule Take 1 capsule (60 mg total) by mouth 2 (two) times daily.  90 capsule  6  . Multiple Vitamins-Minerals (MULTIVITAMIN PO) Take by mouth daily.      . simvastatin (ZOCOR) 40 MG tablet TAKE 1 TABLET (40 MG TOTAL) BY MOUTH AT BEDTIME.  90 tablet  1   No current facility-administered medications on file prior to visit.    BP 140/98  Pulse 80  Temp(Src) 98.3 F (36.8 C)  Wt 191 lb (86.637 kg)    Review of Systems  Constitutional: Negative for fever, chills, appetite change and fatigue.  HENT: Negative for congestion, dental problem, ear pain, hearing loss, sore throat, tinnitus, trouble swallowing and voice change.   Eyes: Negative for pain, discharge and visual disturbance.  Respiratory: Negative for cough, chest tightness, wheezing and stridor.   Cardiovascular: Negative for chest pain, palpitations and leg swelling.  Gastrointestinal: Negative for nausea, vomiting, abdominal pain, diarrhea, constipation, blood in stool and abdominal distention.  Genitourinary: Negative for urgency, hematuria, flank pain, discharge, difficulty urinating and genital sores.  Musculoskeletal: Negative for arthralgias, back pain, gait problem, joint swelling, myalgias  and neck stiffness.  Skin: Negative for rash.  Neurological: Negative for dizziness, syncope, speech difficulty, weakness, numbness and headaches.  Hematological: Negative for adenopathy. Does not bruise/bleed easily.  Psychiatric/Behavioral: Negative for behavioral problems and dysphoric mood. The patient is not nervous/anxious.        Objective:   Physical Exam  Constitutional: He is oriented to person, place, and time. He appears well-developed.  HENT:  Head: Normocephalic.  Right Ear: External ear normal.  Left Ear: External ear normal.  Eyes: Conjunctivae and EOM are normal.  Neck: Normal range of motion.  Cardiovascular: Normal rate and normal heart sounds.   Pulmonary/Chest: Breath sounds  normal.  Abdominal: Bowel sounds are normal.  Musculoskeletal: Normal range of motion. He exhibits no edema and no tenderness.  Neurological: He is alert and oriented to person, place, and time.  Psychiatric: He has a normal mood and affect. His behavior is normal.          Assessment & Plan:   Hypertension.  Repeat blood pressure 134/90.  We'll continue present regimen and recommend a close her home blood pressure monitoring.  Schedule CPX in 3 months Gastroesophageal reflux disease.  Antireflux regimen discussed and information provided.  We'll treat with PPI therapy, which has been quite helpful  Reassess in 3 months

## 2014-05-31 NOTE — Progress Notes (Signed)
Pre visit review using our clinic review tool, if applicable. No additional management support is needed unless otherwise documented below in the visit note. 

## 2014-06-09 ENCOUNTER — Other Ambulatory Visit: Payer: Self-pay | Admitting: Internal Medicine

## 2014-09-11 ENCOUNTER — Ambulatory Visit (INDEPENDENT_AMBULATORY_CARE_PROVIDER_SITE_OTHER): Payer: 59 | Admitting: Family Medicine

## 2014-09-11 ENCOUNTER — Encounter: Payer: Self-pay | Admitting: Family Medicine

## 2014-09-11 VITALS — BP 118/82 | HR 89 | Temp 97.6°F | Wt 193.0 lb

## 2014-09-11 DIAGNOSIS — I10 Essential (primary) hypertension: Secondary | ICD-10-CM

## 2014-09-11 NOTE — Patient Instructions (Signed)
Blood pressure looks great in office.   I think likely you need to hold the wrist cuff up at heart level.   Continue to monitor at home and if elevated above 140/90 regularly, come back to see us and we can verify your home cuff.   See us in 6 months at the latest.

## 2014-09-11 NOTE — Progress Notes (Signed)
  Tana ConchStephen Hunter, MD Phone: [phone]  Subjective:   Christin BachCraig W Yellin is a 63 y.o. year old very pleasant male patient who presents with the following:  Hypertension-well controlled  BP Readings from Last 3 Encounters:  09/11/14 118/82  05/31/14 140/98  11/01/13 130/90   Home BP monitoring-at least 140 and at least 100 at home.  63 years old wrist cuff. Holds it down in his lap.  Compliant with medications-benicar-hctz. yes without side effects  Exercise-3-4x a week for an hour by walking ROS-Denies any CP, HA, SOB, blurry vision.   Past Medical History- Patient Active Problem List   Diagnosis Date Noted  . GERD (gastroesophageal reflux disease) 05/31/2014  . Other testicular hypofunction 12/18/2011  . HYPERLIPIDEMIA 05/16/2009  . DECREASED LIBIDO 05/16/2009  . TEMPOROMANDIBULAR JOINT DISORDER 01/15/2009  . THROMBOCYTOSIS 06/16/2008  . DEPRESSION 05/26/2007  . Essential hypertension 05/26/2007   Medications- reviewed and updated Current Outpatient Prescriptions  Medication Sig Dispense Refill  . BENICAR HCT 40-25 MG per tablet TAKE 1 TABLET BY MOUTH DAILY. 90 tablet 1  . DULoxetine (CYMBALTA) 60 MG capsule Take 1 capsule (60 mg total) by mouth 2 (two) times daily. 90 capsule 6  . Multiple Vitamins-Minerals (MULTIVITAMIN PO) Take by mouth daily.    Marland Kitchen. omeprazole (PRILOSEC) 40 MG capsule Take 1 capsule (40 mg total) by mouth daily. 30 capsule 3  . simvastatin (ZOCOR) 40 MG tablet TAKE 1 TABLET (40 MG TOTAL) BY MOUTH AT BEDTIME. 90 tablet 1   No current facility-administered medications for this visit.    Objective: BP 118/82 mmHg  Pulse 89  Temp(Src) 97.6 F (36.4 C)  Wt 193 lb (87.544 kg) Gen: NAD, resting comfortably CV: RRR no murmurs rubs or gallops Lungs: CTAB no crackles, wheeze, rhonchi Ext: no edema Skin: warm, dry, no rash  Neuro: grossly normal, moves all extremities   Assessment/Plan:  Essential hypertension Well controlled in office on  benicar-hctz and will continue. Poor control at home- I suspect patient's home readings are inaccurate due to his hand position in his lap with wrist cuff. I asked him to bring cuff to next visit. If he maintains <140/90 with making adjustment in measuring at home-follow up 6 months.

## 2014-09-11 NOTE — Assessment & Plan Note (Signed)
Well controlled in office on benicar-hctz and will continue. Poor control at home- I suspect patient's home readings are inaccurate due to his hand position in his lap with wrist cuff. I asked him to bring cuff to next visit. If he maintains <140/90 with making adjustment in measuring at home-follow up 6 months.

## 2014-10-10 ENCOUNTER — Other Ambulatory Visit: Payer: Self-pay | Admitting: Internal Medicine

## 2014-10-14 ENCOUNTER — Other Ambulatory Visit: Payer: Self-pay | Admitting: Internal Medicine

## 2014-11-21 ENCOUNTER — Telehealth: Payer: Self-pay | Admitting: Internal Medicine

## 2014-11-21 MED ORDER — TELMISARTAN-HCTZ 80-25 MG PO TABS: 1.0000 | ORAL_TABLET | Freq: Every day | ORAL | Status: DC

## 2014-11-21 NOTE — Telephone Encounter (Signed)
Please see message and advise 

## 2014-11-21 NOTE — Telephone Encounter (Signed)
Micardis 80/25, #90 one daily refill times 3

## 2014-11-21 NOTE — Telephone Encounter (Signed)
Pt called back told him needed to change blood pressure medication due to insurance would not cover. New Rx sent to pharmacy for Micardis HCT 80-25 mg one tablet daily. Pt verbalized understanding.

## 2014-11-21 NOTE — Telephone Encounter (Signed)
Per [employer] Rx for Benicar is denied. Patient must try and fail a formulary med first. Atacand HCT, Micardis HCT or Diovan HCT.

## 2014-11-21 NOTE — Telephone Encounter (Signed)
Left message on voicemail to call office.  

## 2014-12-05 ENCOUNTER — Other Ambulatory Visit: Payer: Self-pay | Admitting: Internal Medicine

## 2014-12-08 ENCOUNTER — Other Ambulatory Visit: Payer: Self-pay | Admitting: Internal Medicine

## 2015-02-10 ENCOUNTER — Other Ambulatory Visit: Payer: Self-pay | Admitting: Internal Medicine

## 2015-04-05 ENCOUNTER — Other Ambulatory Visit: Payer: Self-pay | Admitting: Internal Medicine

## 2015-05-10 ENCOUNTER — Other Ambulatory Visit: Payer: Self-pay | Admitting: Internal Medicine

## 2015-05-31 ENCOUNTER — Telehealth: Payer: Self-pay | Admitting: *Deleted

## 2015-05-31 ENCOUNTER — Other Ambulatory Visit: Payer: Self-pay | Admitting: Internal Medicine

## 2015-05-31 MED ORDER — SIMVASTATIN 40 MG PO TABS: 40.0000 mg | ORAL_TABLET | Freq: Every day | ORAL | Status: DC

## 2015-05-31 MED ORDER — OMEPRAZOLE 40 MG PO CPDR: DELAYED_RELEASE_CAPSULE | ORAL | Status: DC

## 2015-05-31 NOTE — Telephone Encounter (Signed)
Pt called and needs a refill of his Omeprazole and his Zocor sent to his Pharmacy. Thanks

## 2015-05-31 NOTE — Telephone Encounter (Signed)
Spoke to pt, told him he is due for Physical, will 3 month supply of medications but needs to call back and schedule physical. Pt verbalized understanding. Rx's sent to pharmacy.

## 2015-08-11 ENCOUNTER — Other Ambulatory Visit: Payer: Self-pay | Admitting: Internal Medicine

## 2015-08-27 ENCOUNTER — Other Ambulatory Visit: Payer: Self-pay | Admitting: Internal Medicine

## 2015-08-31 ENCOUNTER — Other Ambulatory Visit (INDEPENDENT_AMBULATORY_CARE_PROVIDER_SITE_OTHER): Payer: BLUE CROSS/BLUE SHIELD

## 2015-08-31 DIAGNOSIS — R7989 Other specified abnormal findings of blood chemistry: Secondary | ICD-10-CM | POA: Diagnosis not present

## 2015-08-31 DIAGNOSIS — Z0000 Encounter for general adult medical examination without abnormal findings: Secondary | ICD-10-CM

## 2015-08-31 LAB — CBC WITH DIFFERENTIAL/PLATELET
Basophils Absolute: 0 10*3/uL (ref 0.0–0.1)
Basophils Relative: 0.7 % (ref 0.0–3.0)
EOS ABS: 0.1 10*3/uL (ref 0.0–0.7)
EOS PCT: 2.4 % (ref 0.0–5.0)
HCT: 44.8 % (ref 39.0–52.0)
HEMOGLOBIN: 15.3 g/dL (ref 13.0–17.0)
Lymphocytes Relative: 28.2 % (ref 12.0–46.0)
Lymphs Abs: 1.6 10*3/uL (ref 0.7–4.0)
MCHC: 34 g/dL (ref 30.0–36.0)
MCV: 94 fl (ref 78.0–100.0)
MONO ABS: 0.7 10*3/uL (ref 0.1–1.0)
Monocytes Relative: 11.8 % (ref 3.0–12.0)
Neutro Abs: 3.2 10*3/uL (ref 1.4–7.7)
Neutrophils Relative %: 56.9 % (ref 43.0–77.0)
Platelets: 396 10*3/uL (ref 150.0–400.0)
RBC: 4.77 Mil/uL (ref 4.22–5.81)
RDW: 12.8 % (ref 11.5–15.5)
WBC: 5.6 10*3/uL (ref 4.0–10.5)

## 2015-08-31 LAB — POCT URINALYSIS DIPSTICK
BILIRUBIN UA: NEGATIVE
Blood, UA: NEGATIVE
GLUCOSE UA: NEGATIVE
Ketones, UA: NEGATIVE
LEUKOCYTES UA: NEGATIVE
NITRITE UA: NEGATIVE
PH UA: 5.5
Protein, UA: NEGATIVE
Spec Grav, UA: 1.025
Urobilinogen, UA: 0.2

## 2015-08-31 LAB — HEPATIC FUNCTION PANEL
ALK PHOS: 98 U/L (ref 39–117)
ALT: 47 U/L (ref 0–53)
AST: 40 U/L — AB (ref 0–37)
Albumin: 4.5 g/dL (ref 3.5–5.2)
BILIRUBIN DIRECT: 0.2 mg/dL (ref 0.0–0.3)
BILIRUBIN TOTAL: 0.6 mg/dL (ref 0.2–1.2)
Total Protein: 7 g/dL (ref 6.0–8.3)

## 2015-08-31 LAB — BASIC METABOLIC PANEL
BUN: 16 mg/dL (ref 6–23)
CO2: 28 mEq/L (ref 19–32)
CREATININE: 1.14 mg/dL (ref 0.40–1.50)
Calcium: 10.6 mg/dL — ABNORMAL HIGH (ref 8.4–10.5)
Chloride: 101 mEq/L (ref 96–112)
GFR: 69.04 mL/min (ref >60.00)
Glucose, Bld: 83 mg/dL (ref 70–99)
POTASSIUM: 4.9 meq/L (ref 3.5–5.1)
Sodium: 142 mEq/L (ref 135–145)

## 2015-08-31 LAB — TSH: TSH: 2.59 u[IU]/mL (ref 0.35–4.50)

## 2015-08-31 LAB — LIPID PANEL
CHOL/HDL RATIO: 3
Cholesterol: 179 mg/dL (ref 0–200)
HDL: 52.1 mg/dL (ref >39.00)
NONHDL: 126.55
TRIGLYCERIDES: 293 mg/dL — AB (ref 0.0–149.0)
VLDL: 58.6 mg/dL — ABNORMAL HIGH (ref 0.0–40.0)

## 2015-08-31 LAB — LDL CHOLESTEROL, DIRECT: Direct LDL: 99 mg/dL

## 2015-08-31 LAB — PSA: PSA: 0.45 ng/mL (ref 0.10–4.00)

## 2015-09-07 ENCOUNTER — Encounter: Payer: Self-pay | Admitting: Internal Medicine

## 2015-09-07 ENCOUNTER — Ambulatory Visit (INDEPENDENT_AMBULATORY_CARE_PROVIDER_SITE_OTHER): Payer: BLUE CROSS/BLUE SHIELD | Admitting: Internal Medicine

## 2015-09-07 VITALS — BP 124/80 | HR 118 | Temp 97.8°F | Ht 68.9 in | Wt 186.0 lb

## 2015-09-07 DIAGNOSIS — Z23 Encounter for immunization: Secondary | ICD-10-CM

## 2015-09-07 DIAGNOSIS — Z0000 Encounter for general adult medical examination without abnormal findings: Secondary | ICD-10-CM | POA: Diagnosis not present

## 2015-09-07 DIAGNOSIS — E785 Hyperlipidemia, unspecified: Secondary | ICD-10-CM

## 2015-09-07 DIAGNOSIS — I10 Essential (primary) hypertension: Secondary | ICD-10-CM

## 2015-09-07 MED ORDER — SIMVASTATIN 40 MG PO TABS: 40.0000 mg | ORAL_TABLET | Freq: Every day | ORAL | Status: DC

## 2015-09-07 MED ORDER — OMEPRAZOLE 40 MG PO CPDR: 40.0000 mg | DELAYED_RELEASE_CAPSULE | Freq: Every day | ORAL | Status: DC

## 2015-09-07 MED ORDER — TELMISARTAN-HCTZ 80-25 MG PO TABS: 1.0000 | ORAL_TABLET | Freq: Every day | ORAL | Status: DC

## 2015-09-07 MED ORDER — DULOXETINE HCL 60 MG PO CPEP: 60.0000 mg | ORAL_CAPSULE | Freq: Two times a day (BID) | ORAL | Status: DC

## 2015-09-07 NOTE — Progress Notes (Signed)
Pre visit review using our clinic review tool, if applicable. No additional management support is needed unless otherwise documented below in the visit note. 

## 2015-09-07 NOTE — Patient Instructions (Signed)
Limit your sodium (Salt) intake  Please check your blood pressure on a regular basis.  If it is consistently greater than 140/90, please make an office appointment.    It is important that you exercise regularly, at least 20 minutes 3 to 4 times per week.  If you develop chest pain or shortness of breath seek  medical attention.  Return in one year for follow-up    

## 2015-09-07 NOTE — Progress Notes (Signed)
Subjective:    Patient ID: Sean Mejia, male    DOB: Nov 12, 1950, 64 y.o.   MRN: [id number]  HPI 64 year-old patient who has treated hypertension history depression and dyslipidemia who is seen today for a preventive health examination. He also has a history of testosterone deficiency and has been on replacement therapy in the past. He did have screening colonoscopy 09/2013. No new concerns or complaints  Family history Brother recently died of liver cancer and a prior history of bladder cancer hepatitis C and alcoholic cirrhosis  Current Allergies:  AMOXICILLIN (AMOXICILLIN)   Past Medical History:   Depression  Hypertension   Past Surgical History:   Denies surgical history  colonoscopy age 64   Family History:  father deceased from MS age 62- of acute MI  mother deceased from copd age 62; history of SLE  Four brothers, one with a history of bladder cancer    Past Medical History  Diagnosis Date  . Depression   . Hypertension   . GERD (gastroesophageal reflux disease)     no medications taken for this  . Hyperlipidemia     Social History   Social History  . Marital Status: Married    Spouse Name: N/A  . Number of Children: N/A  . Years of Education: N/A   Occupational History  . Not on file.   Social History Main Topics  . Smoking status: Never Smoker   . Smokeless tobacco: Never Used  . Alcohol Use: Yes     Comment: 3 times a week  . Drug Use: No  . Sexual Activity: Not on file   Other Topics Concern  . Not on file   Social History Narrative    Past Surgical History  Procedure Laterality Date  . Eye surgery      both eyes    Family History  Problem Relation Age of Onset  . COPD Mother   . Lupus Mother   . Heart attack Father   . Cancer Brother     bladder and liver  . Colon cancer Neg Hx   . Esophageal cancer Neg Hx   . Stomach cancer Neg Hx   . Rectal cancer Neg Hx     Allergies  Allergen Reactions  . Amoxicillin     REACTION:  unspecified  . Penicillins     REACTION: hives    Current Outpatient Prescriptions on File Prior to Visit  Medication Sig Dispense Refill  . BENICAR HCT 40-25 MG per tablet TAKE 1 TABLET BY MOUTH DAILY. *INSURANCE ONLY COVERS 30 DAY SUPPLY 90 tablet 1  . DULoxetine (CYMBALTA) 60 MG capsule Take 1 capsule (60 mg total) by mouth 2 (two) times daily. 90 capsule 6  . Multiple Vitamins-Minerals (MULTIVITAMIN PO) Take by mouth daily.    Marland Kitchen. omeprazole (PRILOSEC) 40 MG capsule TAKE ONE CAPSULE BY MOUTH EVERY DAY 90 capsule 3  . simvastatin (ZOCOR) 40 MG tablet TAKE 1 TABLET BY MOUTH AT BEDTIME 90 tablet 1  . telmisartan-hydrochlorothiazide (MICARDIS HCT) 80-25 MG per tablet TAKE 1 TABLET BY MOUTH DAILY. (Patient not taking: Reported on 09/07/2015) 90 tablet 2   No current facility-administered medications on file prior to visit.    BP 124/80 mmHg  Pulse 118  Temp(Src) 97.8 F (36.6 C) (Oral)  Ht 5' 8.9" (1.75 m)  Wt 186 lb (84.369 kg)  BMI 27.55 kg/m2  SpO2 92%     Review of Systems  Constitutional: Negative for fever, chills, appetite change and fatigue.  HENT: Negative for congestion, dental problem, ear pain, hearing loss, sore throat, tinnitus, trouble swallowing and voice change.   Eyes: Negative for pain, discharge and visual disturbance.  Respiratory: Negative for cough, chest tightness, wheezing and stridor.   Cardiovascular: Negative for chest pain, palpitations and leg swelling.  Gastrointestinal: Negative for nausea, vomiting, abdominal pain, diarrhea, constipation, blood in stool and abdominal distention.  Genitourinary: Negative for urgency, hematuria, flank pain, discharge, difficulty urinating and genital sores.  Musculoskeletal: Negative for myalgias, back pain, joint swelling, arthralgias, gait problem and neck stiffness.  Skin: Negative for rash.  Neurological: Negative for dizziness, syncope, speech difficulty, weakness, numbness and headaches.  Hematological:  Negative for adenopathy. Does not bruise/bleed easily.  Psychiatric/Behavioral: Negative for behavioral problems and dysphoric mood. The patient is not nervous/anxious.        Objective:   Physical Exam  Constitutional: He is oriented to person, place, and time. He appears well-developed and well-nourished.  HENT:  Head: Normocephalic and atraumatic.  Right Ear: External ear normal.  Left Ear: External ear normal.  Nose: Nose normal.  Mouth/Throat: Oropharynx is clear and moist.  Eyes: Conjunctivae and EOM are normal. Pupils are equal, round, and reactive to light. No scleral icterus.  Neck: Normal range of motion. Neck supple. No JVD present. No thyromegaly present.  Cardiovascular: Normal rate, regular rhythm, normal heart sounds and intact distal pulses.  Exam reveals no gallop and no friction rub.   No murmur heard. Pulmonary/Chest: Effort normal and breath sounds normal. He exhibits no tenderness.  Abdominal: Soft. Bowel sounds are normal. He exhibits no distension and no mass. There is no tenderness.  Genitourinary: Penis normal.  Prostate minimally symmetrically enlarged Testicular atrophy  Musculoskeletal: Normal range of motion. He exhibits no edema or tenderness.  Lymphadenopathy:    He has no cervical adenopathy.  Neurological: He is alert and oriented to person, place, and time. He has normal reflexes. No cranial nerve deficit. Coordination normal.  Skin: Skin is warm and dry. No rash noted.  Psychiatric: He has a normal mood and affect. His behavior is normal.          Assessment & Plan:  Preventive health examination Hypertension stable Dyslipidemia. Continue simvastatin. We'll check a lipid profile Testosterone deficiency. Patient does not desire to resume therapy. Bone density recommended at age 11. Patient is aware  We'll continue home blood pressure monitoring Recheck one year

## 2016-01-01 DIAGNOSIS — F330 Major depressive disorder, recurrent, mild: Secondary | ICD-10-CM | POA: Diagnosis not present

## 2016-01-15 DIAGNOSIS — F330 Major depressive disorder, recurrent, mild: Secondary | ICD-10-CM | POA: Diagnosis not present

## 2016-01-29 DIAGNOSIS — F330 Major depressive disorder, recurrent, mild: Secondary | ICD-10-CM | POA: Diagnosis not present

## 2016-02-11 DIAGNOSIS — M2041 Other hammer toe(s) (acquired), right foot: Secondary | ICD-10-CM | POA: Diagnosis not present

## 2016-02-18 DIAGNOSIS — F330 Major depressive disorder, recurrent, mild: Secondary | ICD-10-CM | POA: Diagnosis not present

## 2016-03-24 DIAGNOSIS — F330 Major depressive disorder, recurrent, mild: Secondary | ICD-10-CM | POA: Diagnosis not present

## 2016-04-04 DIAGNOSIS — M2041 Other hammer toe(s) (acquired), right foot: Secondary | ICD-10-CM | POA: Diagnosis not present

## 2016-04-16 DIAGNOSIS — F330 Major depressive disorder, recurrent, mild: Secondary | ICD-10-CM | POA: Diagnosis not present

## 2016-04-29 DIAGNOSIS — L920 Granuloma annulare: Secondary | ICD-10-CM | POA: Diagnosis not present

## 2016-04-29 DIAGNOSIS — F330 Major depressive disorder, recurrent, mild: Secondary | ICD-10-CM | POA: Diagnosis not present

## 2016-05-13 DIAGNOSIS — F330 Major depressive disorder, recurrent, mild: Secondary | ICD-10-CM | POA: Diagnosis not present

## 2016-05-14 DIAGNOSIS — L920 Granuloma annulare: Secondary | ICD-10-CM | POA: Diagnosis not present

## 2016-05-14 DIAGNOSIS — Z79899 Other long term (current) drug therapy: Secondary | ICD-10-CM | POA: Diagnosis not present

## 2016-05-14 DIAGNOSIS — L298 Other pruritus: Secondary | ICD-10-CM | POA: Diagnosis not present

## 2016-06-17 DIAGNOSIS — F330 Major depressive disorder, recurrent, mild: Secondary | ICD-10-CM | POA: Diagnosis not present

## 2016-06-24 ENCOUNTER — Encounter: Payer: Self-pay | Admitting: Internal Medicine

## 2016-06-24 DIAGNOSIS — Z79899 Other long term (current) drug therapy: Secondary | ICD-10-CM | POA: Diagnosis not present

## 2016-06-24 DIAGNOSIS — L920 Granuloma annulare: Secondary | ICD-10-CM | POA: Diagnosis not present

## 2016-06-24 DIAGNOSIS — H2513 Age-related nuclear cataract, bilateral: Secondary | ICD-10-CM | POA: Diagnosis not present

## 2016-06-24 DIAGNOSIS — H25013 Cortical age-related cataract, bilateral: Secondary | ICD-10-CM | POA: Diagnosis not present

## 2016-06-24 DIAGNOSIS — H40013 Open angle with borderline findings, low risk, bilateral: Secondary | ICD-10-CM | POA: Diagnosis not present

## 2016-07-01 DIAGNOSIS — F330 Major depressive disorder, recurrent, mild: Secondary | ICD-10-CM | POA: Diagnosis not present

## 2016-07-15 DIAGNOSIS — Z23 Encounter for immunization: Secondary | ICD-10-CM | POA: Diagnosis not present

## 2016-07-15 DIAGNOSIS — F330 Major depressive disorder, recurrent, mild: Secondary | ICD-10-CM | POA: Diagnosis not present

## 2016-07-29 DIAGNOSIS — F330 Major depressive disorder, recurrent, mild: Secondary | ICD-10-CM | POA: Diagnosis not present

## 2016-08-12 DIAGNOSIS — F330 Major depressive disorder, recurrent, mild: Secondary | ICD-10-CM | POA: Diagnosis not present

## 2016-08-18 DIAGNOSIS — L920 Granuloma annulare: Secondary | ICD-10-CM | POA: Diagnosis not present

## 2016-08-18 DIAGNOSIS — D2262 Melanocytic nevi of left upper limb, including shoulder: Secondary | ICD-10-CM | POA: Diagnosis not present

## 2016-08-18 DIAGNOSIS — B353 Tinea pedis: Secondary | ICD-10-CM | POA: Diagnosis not present

## 2016-08-18 DIAGNOSIS — D2272 Melanocytic nevi of left lower limb, including hip: Secondary | ICD-10-CM | POA: Diagnosis not present

## 2016-09-10 DIAGNOSIS — F330 Major depressive disorder, recurrent, mild: Secondary | ICD-10-CM | POA: Diagnosis not present

## 2016-09-24 DIAGNOSIS — F330 Major depressive disorder, recurrent, mild: Secondary | ICD-10-CM | POA: Diagnosis not present

## 2016-11-01 ENCOUNTER — Other Ambulatory Visit: Payer: Self-pay | Admitting: Internal Medicine

## 2016-11-05 DIAGNOSIS — F330 Major depressive disorder, recurrent, mild: Secondary | ICD-10-CM | POA: Diagnosis not present

## 2016-11-11 ENCOUNTER — Other Ambulatory Visit: Payer: Self-pay | Admitting: Internal Medicine

## 2016-11-19 DIAGNOSIS — F330 Major depressive disorder, recurrent, mild: Secondary | ICD-10-CM | POA: Diagnosis not present

## 2016-12-03 DIAGNOSIS — F330 Major depressive disorder, recurrent, mild: Secondary | ICD-10-CM | POA: Diagnosis not present

## 2016-12-17 DIAGNOSIS — F330 Major depressive disorder, recurrent, mild: Secondary | ICD-10-CM | POA: Diagnosis not present

## 2016-12-22 ENCOUNTER — Encounter: Payer: Self-pay | Admitting: Internal Medicine

## 2016-12-22 ENCOUNTER — Ambulatory Visit (INDEPENDENT_AMBULATORY_CARE_PROVIDER_SITE_OTHER): Payer: BLUE CROSS/BLUE SHIELD | Admitting: Internal Medicine

## 2016-12-22 VITALS — BP 122/74 | HR 90 | Temp 98.2°F | Ht 68.5 in | Wt 188.6 lb

## 2016-12-22 DIAGNOSIS — I10 Essential (primary) hypertension: Secondary | ICD-10-CM | POA: Diagnosis not present

## 2016-12-22 DIAGNOSIS — E785 Hyperlipidemia, unspecified: Secondary | ICD-10-CM | POA: Diagnosis not present

## 2016-12-22 MED ORDER — DULOXETINE HCL 60 MG PO CPEP: 60.0000 mg | ORAL_CAPSULE | Freq: Two times a day (BID) | ORAL | 2 refills | Status: DC

## 2016-12-22 MED ORDER — TELMISARTAN-HCTZ 80-25 MG PO TABS: 1.0000 | ORAL_TABLET | Freq: Every day | ORAL | 2 refills | Status: DC

## 2016-12-22 MED ORDER — SIMVASTATIN 40 MG PO TABS: 40.0000 mg | ORAL_TABLET | Freq: Every day | ORAL | 2 refills | Status: DC

## 2016-12-22 MED ORDER — OMEPRAZOLE 40 MG PO CPDR: 40.0000 mg | DELAYED_RELEASE_CAPSULE | Freq: Every day | ORAL | 0 refills | Status: DC

## 2016-12-22 NOTE — Progress Notes (Signed)
Subjective:    Patient ID: Sean Mejia, male    DOB: 12/25/1950, 66 y.o.   MRN: [id number]  HPI  66 year old patient who has essential hypertension and dyslipidemia.  He has not been seen in over one year, but continues to do quite well.  Seen today for basically medication update.  Blood pressure well controlled today.  Denies any cardiopulmonary complaints.  He has a history depression, which has been stable  Past Medical History:  Diagnosis Date  . Depression   . GERD (gastroesophageal reflux disease)    no medications taken for this  . Hyperlipidemia   . Hypertension      Social History   Social History  . Marital status: Married    Spouse name: N/A  . Number of children: N/A  . Years of education: N/A   Occupational History  . Not on file.   Social History Main Topics  . Smoking status: Never Smoker  . Smokeless tobacco: Never Used  . Alcohol use Yes     Comment: 3 times a week  . Drug use: No  . Sexual activity: Not on file   Other Topics Concern  . Not on file   Social History Narrative  . No narrative on file    Past Surgical History:  Procedure Laterality Date  . EYE SURGERY     both eyes    Family History  Problem Relation Age of Onset  . COPD Mother   . Lupus Mother   . Heart attack Father   . Cancer Brother     bladder and liver  . Colon cancer Neg Hx   . Esophageal cancer Neg Hx   . Stomach cancer Neg Hx   . Rectal cancer Neg Hx     Allergies  Allergen Reactions  . Amoxicillin     REACTION: unspecified  . Penicillins     REACTION: hives    Current Outpatient Prescriptions on File Prior to Visit  Medication Sig Dispense Refill  . Multiple Vitamins-Minerals (MULTIVITAMIN PO) Take by mouth daily.     No current facility-administered medications on file prior to visit.     BP 122/74 (BP Location: Left Arm, Patient Position: Sitting, Cuff Size: Normal)   Pulse 90   Temp 98.2 F (36.8 C) (Oral)   Ht 5' 8.5" (1.74 m)   Wt  188 lb 9.6 oz (85.5 kg)   SpO2 96%   BMI 28.26 kg/m     Review of Systems  Constitutional: Negative for appetite change, chills, fatigue and fever.  HENT: Negative for congestion, dental problem, ear pain, hearing loss, sore throat, tinnitus, trouble swallowing and voice change.   Eyes: Negative for pain, discharge and visual disturbance.  Respiratory: Negative for cough, chest tightness, wheezing and stridor.   Cardiovascular: Negative for chest pain, palpitations and leg swelling.  Gastrointestinal: Negative for abdominal distention, abdominal pain, blood in stool, constipation, diarrhea, nausea and vomiting.  Genitourinary: Negative for difficulty urinating, discharge, flank pain, genital sores, hematuria and urgency.  Musculoskeletal: Negative for arthralgias, back pain, gait problem, joint swelling, myalgias and neck stiffness.  Skin: Negative for rash.  Neurological: Negative for dizziness, syncope, speech difficulty, weakness, numbness and headaches.  Hematological: Negative for adenopathy. Does not bruise/bleed easily.  Psychiatric/Behavioral: Negative for behavioral problems and dysphoric mood. The patient is not nervous/anxious.        Objective:   Physical Exam  Constitutional: He is oriented to person, place, and time. He appears well-developed.  HENT:  Head: Normocephalic.  Right Ear: External ear normal.  Left Ear: External ear normal.  Eyes: Conjunctivae and EOM are normal.  Neck: Normal range of motion.  Cardiovascular: Normal rate and normal heart sounds.   Pulmonary/Chest: Breath sounds normal.  Abdominal: Bowel sounds are normal.  Musculoskeletal: Normal range of motion. He exhibits no edema or tenderness.  Neurological: He is alert and oriented to person, place, and time.  Psychiatric: He has a normal mood and affect. His behavior is normal.          Assessment & Plan:    Essential hypertension, well-controlled Dyslipidemia .  Continue statin  therapy History depression, stable  Schedule CPX  KWIATKOWSKI,PETER FRANK   No change in therapy

## 2016-12-22 NOTE — Progress Notes (Signed)
Pre visit review using our clinic review tool, if applicable. No additional management support is needed unless otherwise documented below in the visit note. 

## 2016-12-22 NOTE — Patient Instructions (Signed)
Limit your sodium (Salt) intake  Please check your blood pressure on a regular basis.  If it is consistently greater than 150/90, please make an office appointment.    It is important that you exercise regularly, at least 20 minutes 3 to 4 times per week.  If you develop chest pain or shortness of breath seek  medical attention.  Return in 6 months for follow-up  

## 2016-12-25 DIAGNOSIS — H43391 Other vitreous opacities, right eye: Secondary | ICD-10-CM | POA: Diagnosis not present

## 2016-12-25 DIAGNOSIS — H40013 Open angle with borderline findings, low risk, bilateral: Secondary | ICD-10-CM | POA: Diagnosis not present

## 2016-12-25 DIAGNOSIS — H40011 Open angle with borderline findings, low risk, right eye: Secondary | ICD-10-CM | POA: Diagnosis not present

## 2016-12-25 DIAGNOSIS — H40012 Open angle with borderline findings, low risk, left eye: Secondary | ICD-10-CM | POA: Diagnosis not present

## 2016-12-31 DIAGNOSIS — F330 Major depressive disorder, recurrent, mild: Secondary | ICD-10-CM | POA: Diagnosis not present

## 2017-01-14 DIAGNOSIS — F330 Major depressive disorder, recurrent, mild: Secondary | ICD-10-CM | POA: Diagnosis not present

## 2017-02-11 DIAGNOSIS — F330 Major depressive disorder, recurrent, mild: Secondary | ICD-10-CM | POA: Diagnosis not present

## 2017-03-19 DIAGNOSIS — F330 Major depressive disorder, recurrent, mild: Secondary | ICD-10-CM | POA: Diagnosis not present

## 2017-04-02 DIAGNOSIS — F330 Major depressive disorder, recurrent, mild: Secondary | ICD-10-CM | POA: Diagnosis not present

## 2017-04-23 DIAGNOSIS — F330 Major depressive disorder, recurrent, mild: Secondary | ICD-10-CM | POA: Diagnosis not present

## 2017-05-06 ENCOUNTER — Other Ambulatory Visit: Payer: Self-pay | Admitting: Internal Medicine

## 2017-05-07 DIAGNOSIS — F330 Major depressive disorder, recurrent, mild: Secondary | ICD-10-CM | POA: Diagnosis not present

## 2017-05-11 DIAGNOSIS — L920 Granuloma annulare: Secondary | ICD-10-CM | POA: Diagnosis not present

## 2017-05-11 DIAGNOSIS — L308 Other specified dermatitis: Secondary | ICD-10-CM | POA: Diagnosis not present

## 2017-05-29 DIAGNOSIS — L920 Granuloma annulare: Secondary | ICD-10-CM | POA: Diagnosis not present

## 2017-05-29 DIAGNOSIS — Z79899 Other long term (current) drug therapy: Secondary | ICD-10-CM | POA: Diagnosis not present

## 2017-06-11 ENCOUNTER — Encounter: Payer: Self-pay | Admitting: Internal Medicine

## 2017-06-19 DIAGNOSIS — F330 Major depressive disorder, recurrent, mild: Secondary | ICD-10-CM | POA: Diagnosis not present

## 2017-06-26 DIAGNOSIS — H40012 Open angle with borderline findings, low risk, left eye: Secondary | ICD-10-CM | POA: Diagnosis not present

## 2017-06-26 DIAGNOSIS — H43391 Other vitreous opacities, right eye: Secondary | ICD-10-CM | POA: Diagnosis not present

## 2017-06-26 DIAGNOSIS — H40013 Open angle with borderline findings, low risk, bilateral: Secondary | ICD-10-CM | POA: Diagnosis not present

## 2017-06-26 DIAGNOSIS — H25013 Cortical age-related cataract, bilateral: Secondary | ICD-10-CM | POA: Diagnosis not present

## 2017-06-26 DIAGNOSIS — H40011 Open angle with borderline findings, low risk, right eye: Secondary | ICD-10-CM | POA: Diagnosis not present

## 2017-06-26 DIAGNOSIS — H2513 Age-related nuclear cataract, bilateral: Secondary | ICD-10-CM | POA: Diagnosis not present

## 2017-07-03 DIAGNOSIS — R21 Rash and other nonspecific skin eruption: Secondary | ICD-10-CM | POA: Diagnosis not present

## 2017-07-03 DIAGNOSIS — L920 Granuloma annulare: Secondary | ICD-10-CM | POA: Diagnosis not present

## 2017-07-14 DIAGNOSIS — Z23 Encounter for immunization: Secondary | ICD-10-CM | POA: Diagnosis not present

## 2017-07-30 DIAGNOSIS — F330 Major depressive disorder, recurrent, mild: Secondary | ICD-10-CM | POA: Diagnosis not present

## 2017-08-31 ENCOUNTER — Other Ambulatory Visit: Payer: Self-pay | Admitting: Internal Medicine

## 2017-09-04 DIAGNOSIS — M6740 Ganglion, unspecified site: Secondary | ICD-10-CM | POA: Diagnosis not present

## 2017-09-04 DIAGNOSIS — D2372 Other benign neoplasm of skin of left lower limb, including hip: Secondary | ICD-10-CM | POA: Diagnosis not present

## 2017-09-04 DIAGNOSIS — L920 Granuloma annulare: Secondary | ICD-10-CM | POA: Diagnosis not present

## 2017-09-04 DIAGNOSIS — L821 Other seborrheic keratosis: Secondary | ICD-10-CM | POA: Diagnosis not present

## 2017-09-16 ENCOUNTER — Encounter: Payer: Self-pay | Admitting: Internal Medicine

## 2017-09-24 ENCOUNTER — Ambulatory Visit (INDEPENDENT_AMBULATORY_CARE_PROVIDER_SITE_OTHER): Payer: BLUE CROSS/BLUE SHIELD | Admitting: Internal Medicine

## 2017-09-24 ENCOUNTER — Encounter: Payer: Self-pay | Admitting: Internal Medicine

## 2017-09-24 VITALS — BP 124/74 | HR 76 | Temp 98.3°F | Ht 68.5 in | Wt 176.6 lb

## 2017-09-24 DIAGNOSIS — E785 Hyperlipidemia, unspecified: Secondary | ICD-10-CM

## 2017-09-24 DIAGNOSIS — I10 Essential (primary) hypertension: Secondary | ICD-10-CM

## 2017-09-24 DIAGNOSIS — Z125 Encounter for screening for malignant neoplasm of prostate: Secondary | ICD-10-CM

## 2017-09-24 DIAGNOSIS — K219 Gastro-esophageal reflux disease without esophagitis: Secondary | ICD-10-CM

## 2017-09-24 DIAGNOSIS — Z0000 Encounter for general adult medical examination without abnormal findings: Secondary | ICD-10-CM

## 2017-09-24 LAB — COMPREHENSIVE METABOLIC PANEL
ALK PHOS: 67 U/L (ref 39–117)
ALT: 24 U/L (ref 0–53)
AST: 30 U/L (ref 0–37)
Albumin: 4.3 g/dL (ref 3.5–5.2)
BILIRUBIN TOTAL: 0.6 mg/dL (ref 0.2–1.2)
BUN: 21 mg/dL (ref 6–23)
CO2: 29 meq/L (ref 19–32)
Calcium: 9.9 mg/dL (ref 8.4–10.5)
Chloride: 102 mEq/L (ref 96–112)
Creatinine, Ser: 1.12 mg/dL (ref 0.40–1.50)
GFR: 70 mL/min (ref >60.00)
GLUCOSE: 79 mg/dL (ref 70–99)
Potassium: 3.9 mEq/L (ref 3.5–5.1)
SODIUM: 141 meq/L (ref 135–145)
TOTAL PROTEIN: 6.5 g/dL (ref 6.0–8.3)

## 2017-09-24 LAB — LIPID PANEL
Cholesterol: 159 mg/dL (ref 0–200)
HDL: 53 mg/dL (ref >39.00)
NONHDL: 106.42
TRIGLYCERIDES: 233 mg/dL — AB (ref 0.0–149.0)
Total CHOL/HDL Ratio: 3
VLDL: 46.6 mg/dL — ABNORMAL HIGH (ref 0.0–40.0)

## 2017-09-24 LAB — CBC WITH DIFFERENTIAL/PLATELET
Basophils Absolute: 0.1 10*3/uL (ref 0.0–0.1)
Basophils Relative: 1.8 % (ref 0.0–3.0)
Eosinophils Absolute: 0.1 10*3/uL (ref 0.0–0.7)
Eosinophils Relative: 3.6 % (ref 0.0–5.0)
HEMATOCRIT: 41.5 % (ref 39.0–52.0)
HEMOGLOBIN: 14.2 g/dL (ref 13.0–17.0)
Lymphocytes Relative: 26.5 % (ref 12.0–46.0)
Lymphs Abs: 0.8 10*3/uL (ref 0.7–4.0)
MCHC: 34.2 g/dL (ref 30.0–36.0)
MCV: 96.1 fl (ref 78.0–100.0)
Monocytes Absolute: 0.4 10*3/uL (ref 0.1–1.0)
Monocytes Relative: 11.2 % (ref 3.0–12.0)
Neutro Abs: 1.8 10*3/uL (ref 1.4–7.7)
Neutrophils Relative %: 56.9 % (ref 43.0–77.0)
Platelets: 307 10*3/uL (ref 150.0–400.0)
RBC: 4.32 Mil/uL (ref 4.22–5.81)
RDW: 12.8 % (ref 11.5–15.5)
WBC: 3.2 10*3/uL — ABNORMAL LOW (ref 4.0–10.5)

## 2017-09-24 LAB — LDL CHOLESTEROL, DIRECT: LDL DIRECT: 91 mg/dL

## 2017-09-24 LAB — TSH: TSH: 2.42 u[IU]/mL (ref 0.35–4.50)

## 2017-09-24 LAB — PSA: PSA: 0.86 ng/mL (ref 0.10–4.00)

## 2017-09-24 NOTE — Progress Notes (Signed)
Subjective:    Patient ID: Sean Mejia, male    DOB: 12/08/1950, 67 y.o.   MRN: [id number]  HPI  67 year old patient who is seen today for a preventive health examination. He does quite well with a history of hypertension and dyslipidemia.  He remains quite active with exercise program at his health club.  He also enjoys walking.  No cardiopulmonary complaints he has history of GERD which has been stable on omeprazole. He has stable chronic depression which has been controlled with Cymbalta. He has seen psychiatry in the past and recently was released from his therapist. No concerns or complaints  Family history-father had severe MS and died of an acute MI.  Mother died of complications of emphysema at 268.  One brother had a history of liver cancer and possible chronic hepatitis.  2 other brothers are well.  No sisters   Past Medical History:  Diagnosis Date  . Depression   . GERD (gastroesophageal reflux disease)    no medications taken for this  . Hyperlipidemia   . Hypertension      Social History   Socioeconomic History  . Marital status: Married    Spouse name: Not on file  . Number of children: Not on file  . Years of education: Not on file  . Highest education level: Not on file  Social Needs  . Financial resource strain: Not on file  . Food insecurity - worry: Not on file  . Food insecurity - inability: Not on file  . Transportation needs - medical: Not on file  . Transportation needs - non-medical: Not on file  Occupational History  . Not on file  Tobacco Use  . Smoking status: Never Smoker  . Smokeless tobacco: Never Used  Substance and Sexual Activity  . Alcohol use: Yes    Comment: 3 times a week  . Drug use: No  . Sexual activity: Not on file  Other Topics Concern  . Not on file  Social History Narrative  . Not on file    Past Surgical History:  Procedure Laterality Date  . EYE SURGERY     both eyes    Family History  Problem Relation  Age of Onset  . COPD Mother   . Lupus Mother   . Heart attack Father   . Cancer Brother        bladder and liver  . Colon cancer Neg Hx   . Esophageal cancer Neg Hx   . Stomach cancer Neg Hx   . Rectal cancer Neg Hx     Allergies  Allergen Reactions  . Amoxicillin     REACTION: unspecified  . Penicillins     REACTION: hives    Current Outpatient Medications on File Prior to Visit  Medication Sig Dispense Refill  . DULoxetine (CYMBALTA) 60 MG capsule Take 1 capsule (60 mg total) by mouth 2 (two) times daily. 180 capsule 2  . Multiple Vitamins-Minerals (MULTIVITAMIN PO) Take by mouth daily.    Marland Kitchen. omeprazole (PRILOSEC) 40 MG capsule TAKE 1 CAPSULE (40 MG TOTAL) BY MOUTH DAILY. 90 capsule 0  . simvastatin (ZOCOR) 40 MG tablet Take 1 tablet (40 mg total) by mouth at bedtime. 90 tablet 2  . telmisartan-hydrochlorothiazide (MICARDIS HCT) 80-25 MG tablet Take 1 tablet by mouth daily. 90 tablet 2   No current facility-administered medications on file prior to visit.     BP 124/74 (BP Location: Left Arm, Patient Position: Sitting, Cuff Size: Normal)  Pulse 76   Temp 98.3 F (36.8 C) (Oral)   Ht 5' 8.5" (1.74 m)   Wt 176 lb 9.6 oz (80.1 kg)   SpO2 96%   BMI 26.46 kg/m     Review of Systems  Constitutional: Negative for appetite change, chills, fatigue and fever.  HENT: Negative for congestion, dental problem, ear pain, hearing loss, sore throat, tinnitus, trouble swallowing and voice change.   Eyes: Negative for pain, discharge and visual disturbance.  Respiratory: Negative for cough, chest tightness, wheezing and stridor.   Cardiovascular: Negative for chest pain, palpitations and leg swelling.  Gastrointestinal: Negative for abdominal distention, abdominal pain, blood in stool, constipation, diarrhea, nausea and vomiting.  Genitourinary: Negative for difficulty urinating, discharge, flank pain, genital sores, hematuria and urgency.  Musculoskeletal: Negative for arthralgias,  back pain, gait problem, joint swelling, myalgias and neck stiffness.  Skin: Negative for rash.  Neurological: Negative for dizziness, syncope, speech difficulty, weakness, numbness and headaches.  Hematological: Negative for adenopathy. Does not bruise/bleed easily.  Psychiatric/Behavioral: Negative for behavioral problems and dysphoric mood. The patient is not nervous/anxious.        Objective:   Physical Exam  Constitutional: He appears well-developed and well-nourished.  HENT:  Head: Normocephalic and atraumatic.  Right Ear: External ear normal.  Left Ear: External ear normal.  Nose: Nose normal.  Mouth/Throat: Oropharynx is clear and moist.  Eyes: Conjunctivae and EOM are normal. Pupils are equal, round, and reactive to light. No scleral icterus.  Neck: Normal range of motion. Neck supple. No JVD present. No thyromegaly present.  Cardiovascular: Regular rhythm, normal heart sounds and intact distal pulses. Exam reveals no gallop and no friction rub.  No murmur heard. Pulmonary/Chest: Effort normal and breath sounds normal. He exhibits no tenderness.  Abdominal: Soft. Bowel sounds are normal. He exhibits no distension and no mass. There is no tenderness.  Genitourinary: Prostate normal and penis normal. Rectal exam shows guaiac negative stool.  Musculoskeletal: Normal range of motion. He exhibits no edema or tenderness.  Lymphadenopathy:    He has no cervical adenopathy.  Neurological: He is alert. He has normal reflexes. No cranial nerve deficit. Coordination normal.  Skin: Skin is warm and dry. No rash noted.  Psychiatric: He has a normal mood and affect. His behavior is normal.          Assessment & Plan:  Preventive health examination  Essential hypertension well-controlled Dyslipidemia.  Will review a lipid profile GERD.  Will challenge off PPI therapy Depression stable   Will review screening lab Continue home blood pressure monitoring  Follow-up 1  year  Rogelia Boga

## 2017-09-24 NOTE — Patient Instructions (Signed)
Limit your sodium (Salt) intake  Avoids foods high in acid such as tomatoes citrus juices, and spicy foods.  Avoid eating within two hours of lying down or before exercising.  Do not overheat.  Try smaller more frequent meals.      It is important that you exercise regularly, at least 20 minutes 3 to 4 times per week.  If you develop chest pain or shortness of breath seek  medical attention.  Please check your blood pressure on a regular basis.  If it is consistently greater than 140/90, please make an office appointment.  Return in one year for follow-up

## 2017-09-25 LAB — HEPATITIS C ANTIBODY
Hepatitis C Ab: NONREACTIVE
SIGNAL TO CUT-OFF: 0.01 (ref <1.00)

## 2017-10-20 DIAGNOSIS — L821 Other seborrheic keratosis: Secondary | ICD-10-CM | POA: Diagnosis not present

## 2017-10-20 DIAGNOSIS — L814 Other melanin hyperpigmentation: Secondary | ICD-10-CM | POA: Diagnosis not present

## 2017-10-20 DIAGNOSIS — I788 Other diseases of capillaries: Secondary | ICD-10-CM | POA: Diagnosis not present

## 2017-10-20 DIAGNOSIS — L570 Actinic keratosis: Secondary | ICD-10-CM | POA: Diagnosis not present

## 2017-10-20 DIAGNOSIS — D225 Melanocytic nevi of trunk: Secondary | ICD-10-CM | POA: Diagnosis not present

## 2017-11-19 DIAGNOSIS — M79641 Pain in right hand: Secondary | ICD-10-CM | POA: Diagnosis not present

## 2017-11-19 DIAGNOSIS — M720 Palmar fascial fibromatosis [Dupuytren]: Secondary | ICD-10-CM | POA: Diagnosis not present

## 2017-12-24 DIAGNOSIS — H40013 Open angle with borderline findings, low risk, bilateral: Secondary | ICD-10-CM | POA: Diagnosis not present

## 2017-12-24 DIAGNOSIS — Z9229 Personal history of other drug therapy: Secondary | ICD-10-CM | POA: Diagnosis not present

## 2018-01-09 ENCOUNTER — Other Ambulatory Visit: Payer: Self-pay | Admitting: Internal Medicine

## 2018-02-23 ENCOUNTER — Other Ambulatory Visit: Payer: Self-pay | Admitting: Internal Medicine

## 2018-03-03 ENCOUNTER — Telehealth: Payer: Self-pay | Admitting: Internal Medicine

## 2018-03-03 NOTE — Telephone Encounter (Signed)
Copied from CRM [phone]#[id number]. Topic: Quick Communication - Rx Refill/Question >> Mar 03, 2018  2:52 PM Sean Mejia, Sean Mejia, [place] wrote: Medication: DULoxetine (CYMBALTA) 60 MG capsule [[id number]][[id number]]   Has the patient contacted their pharmacy? yes (Agent: If no, request that the patient contact the pharmacy for the refill.) (Agent: If yes, when and what did the pharmacy advise?)  Preferred Pharmacy (with phone number or street name): [id number] - [place], [place] - [address] [facility] [address] [place] [id number] Phone: [phone] Fax: [phone]    Agent: Please be advised that RX refills may take up to 3 business days. We ask that you follow-up with your pharmacy.

## 2018-03-04 MED ORDER — DULOXETINE HCL 60 MG PO CPEP: 60.0000 mg | ORAL_CAPSULE | Freq: Two times a day (BID) | ORAL | 1 refills | Status: DC

## 2018-05-24 ENCOUNTER — Other Ambulatory Visit: Payer: Self-pay | Admitting: Internal Medicine

## 2018-06-28 DIAGNOSIS — H43811 Vitreous degeneration, right eye: Secondary | ICD-10-CM | POA: Diagnosis not present

## 2018-06-28 DIAGNOSIS — H25013 Cortical age-related cataract, bilateral: Secondary | ICD-10-CM | POA: Diagnosis not present

## 2018-06-28 DIAGNOSIS — H2513 Age-related nuclear cataract, bilateral: Secondary | ICD-10-CM | POA: Diagnosis not present

## 2018-06-28 DIAGNOSIS — H40013 Open angle with borderline findings, low risk, bilateral: Secondary | ICD-10-CM | POA: Diagnosis not present

## 2018-07-14 DIAGNOSIS — Z23 Encounter for immunization: Secondary | ICD-10-CM | POA: Diagnosis not present

## 2018-07-15 ENCOUNTER — Ambulatory Visit: Payer: BLUE CROSS/BLUE SHIELD | Admitting: Family Medicine

## 2018-07-15 ENCOUNTER — Encounter: Payer: Self-pay | Admitting: Family Medicine

## 2018-07-15 VITALS — BP 102/80 | HR 88 | Temp 97.9°F | Wt 175.0 lb

## 2018-07-15 DIAGNOSIS — I10 Essential (primary) hypertension: Secondary | ICD-10-CM

## 2018-07-15 DIAGNOSIS — E782 Mixed hyperlipidemia: Secondary | ICD-10-CM | POA: Diagnosis not present

## 2018-07-15 MED ORDER — TELMISARTAN-HCTZ 40-12.5 MG PO TABS: 1.0000 | ORAL_TABLET | Freq: Every day | ORAL | 3 refills | Status: DC

## 2018-07-15 NOTE — Patient Instructions (Signed)
We will reduce your dose of telmisartan-hydrochlorothiazide to 40-12.5 mg daily.  Continue checking your blood pressure daily.  If you notice an increase in your blood pressure > 140/90 bleeding will likely need to go back to the previous dose.  Continue exercising and working on your eating habits.

## 2018-07-15 NOTE — Progress Notes (Signed)
Subjective:    Patient ID: Sean Mejia, male    DOB: 08-25-1951, 67 y.o.   MRN: [id number]  No chief complaint on file.   HPI Patient was seen today for follow-up on chronic conditions and TOC, previously seen by Dr. Amador Cunas.  HTN: -Patient taking telmisartan-hydrochlorothiazide 80-25 mg daily -Patient checks BP daily at home. -Notices a decrease in blood pressure since exercising consistently.  Typically low 100s over 80. -Patient denies headaches, blurred vision, chest pain  HLD: -Patient taking simvastatin 40 mg daily -Denies myalgias -States eating out a lot, eating chicken and fish but needs to increase intake of fruits.  Past Medical History:  Diagnosis Date  . Depression   . GERD (gastroesophageal reflux disease)    no medications taken for this  . Hyperlipidemia   . Hypertension     Allergies  Allergen Reactions  . Amoxicillin     REACTION: unspecified  . Penicillins     REACTION: hives    ROS General: Denies fever, chills, night sweats, changes in weight, changes in appetite HEENT: Denies headaches, ear pain, changes in vision, rhinorrhea, sore throat CV: Denies CP, palpitations, SOB, orthopnea Pulm: Denies SOB, cough, wheezing GI: Denies abdominal pain, nausea, vomiting, diarrhea, constipation GU: Denies dysuria, hematuria, frequency, vaginal discharge Msk: Denies muscle cramps, joint pains Neuro: Denies weakness, numbness, tingling Skin: Denies rashes, bruising Psych: Denies depression, anxiety, hallucinations      Objective:    Blood pressure 102/80, pulse 88, temperature 97.9 F (36.6 C), temperature source Oral, weight 175 lb (79.4 kg), SpO2 95 %.   Gen. Pleasant, well-nourished, in no distress, normal affect   HEENT: [place]/AT, face symmetric, no scleral icterus, PERRLA, nares patent without drainage Lungs: no accessory muscle use, CTAB, no wheezes or rales Cardiovascular: RRR, no m/r/g, no peripheral edema Neuro:  A&Ox3, CN II-XII  intact, normal gait  Wt Readings from Last 3 Encounters:  07/15/18 175 lb (79.4 kg)  09/24/17 176 lb 9.6 oz (80.1 kg)  12/22/16 188 lb 9.6 oz (85.5 kg)    Lab Results  Component Value Date   WBC 3.2 (L) 09/24/2017   HGB 14.2 09/24/2017   HCT 41.5 09/24/2017   PLT 307.0 09/24/2017   GLUCOSE 79 09/24/2017   CHOL 159 09/24/2017   TRIG 233.0 (H) 09/24/2017   HDL 53.00 09/24/2017   LDLDIRECT 91.0 09/24/2017   LDLCALC 87 07/14/2013   ALT 24 09/24/2017   AST 30 09/24/2017   NA 141 09/24/2017   K 3.9 09/24/2017   CL 102 09/24/2017   CREATININE 1.12 09/24/2017   BUN 21 09/24/2017   CO2 29 09/24/2017   TSH 2.42 09/24/2017   PSA 0.86 09/24/2017    Assessment/Plan:  Essential hypertension -Controlled -Continue lifestyle modifications -We will reduce dose of Micardis from 80-25 to 40-12.5 mg daily. -continue checking BP at home and keeping a log. - Plan: telmisartan-hydrochlorothiazide (MICARDIS HCT) 40-12.5 MG tablet  Mixed hyperlipidemia -Continue simvastatin 40 mg daily -Lifestyle modifications encouraged -Continue increasing physical activity daily  Follow-up PRN.  Next CPE in January 2020  Abbe Amsterdam, MD

## 2018-08-23 ENCOUNTER — Other Ambulatory Visit: Payer: Self-pay | Admitting: Internal Medicine

## 2018-08-29 ENCOUNTER — Other Ambulatory Visit: Payer: Self-pay | Admitting: Internal Medicine

## 2018-09-06 ENCOUNTER — Other Ambulatory Visit: Payer: Self-pay

## 2018-09-06 MED ORDER — SIMVASTATIN 40 MG PO TABS: 40.0000 mg | ORAL_TABLET | Freq: Every day | ORAL | 1 refills | Status: DC

## 2018-09-21 ENCOUNTER — Other Ambulatory Visit: Payer: Self-pay

## 2018-09-21 ENCOUNTER — Telehealth: Payer: Self-pay

## 2018-09-21 MED ORDER — OMEPRAZOLE 40 MG PO CPDR: 40.0000 mg | DELAYED_RELEASE_CAPSULE | ORAL | 0 refills | Status: DC

## 2018-09-21 NOTE — Telephone Encounter (Signed)
Pt takes Cymbalta 60 mg last prescribed by dr Kirtland BouchardK, PT LOV was 07/15/2018 and last refill was done on 03/04/2018 for 180 tablets with 1 refill, pt requests refills on Rx. Please advise

## 2018-09-26 NOTE — Telephone Encounter (Signed)
Ok to send in Cymbalta 60 mg 3 months with 1 refill.  Pt is suppose to f/u in the next few wks.

## 2018-09-28 ENCOUNTER — Other Ambulatory Visit: Payer: Self-pay

## 2018-09-28 MED ORDER — DULOXETINE HCL 60 MG PO CPEP: 60.0000 mg | ORAL_CAPSULE | Freq: Two times a day (BID) | ORAL | 1 refills | Status: DC

## 2018-09-28 NOTE — Telephone Encounter (Signed)
Rx was sent to pt pharmacy for refills per Dr Salomon Fick, pt states that he will be calling the office to schedule a CPE

## 2018-10-14 ENCOUNTER — Other Ambulatory Visit: Payer: Self-pay | Admitting: Internal Medicine

## 2018-10-21 ENCOUNTER — Encounter: Payer: Self-pay | Admitting: Family Medicine

## 2018-10-21 DIAGNOSIS — M720 Palmar fascial fibromatosis [Dupuytren]: Secondary | ICD-10-CM | POA: Diagnosis not present

## 2018-10-21 DIAGNOSIS — M79641 Pain in right hand: Secondary | ICD-10-CM | POA: Diagnosis not present

## 2018-10-28 ENCOUNTER — Encounter: Payer: Self-pay | Admitting: Family Medicine

## 2018-10-28 ENCOUNTER — Ambulatory Visit (INDEPENDENT_AMBULATORY_CARE_PROVIDER_SITE_OTHER): Payer: BLUE CROSS/BLUE SHIELD | Admitting: Family Medicine

## 2018-10-28 VITALS — BP 110/84 | HR 94 | Temp 97.5°F | Wt 181.0 lb

## 2018-10-28 DIAGNOSIS — I10 Essential (primary) hypertension: Secondary | ICD-10-CM

## 2018-10-28 DIAGNOSIS — Z23 Encounter for immunization: Secondary | ICD-10-CM

## 2018-10-28 DIAGNOSIS — Z0000 Encounter for general adult medical examination without abnormal findings: Secondary | ICD-10-CM | POA: Diagnosis not present

## 2018-10-28 DIAGNOSIS — Z125 Encounter for screening for malignant neoplasm of prostate: Secondary | ICD-10-CM

## 2018-10-28 DIAGNOSIS — Z131 Encounter for screening for diabetes mellitus: Secondary | ICD-10-CM | POA: Diagnosis not present

## 2018-10-28 DIAGNOSIS — E785 Hyperlipidemia, unspecified: Secondary | ICD-10-CM

## 2018-10-28 MED ORDER — DULOXETINE HCL 60 MG PO CPEP: 60.0000 mg | ORAL_CAPSULE | Freq: Every day | ORAL | 3 refills | Status: DC

## 2018-10-28 NOTE — Addendum Note (Signed)
Addended by: Carola Rhine on: 10/28/2018 03:26 PM   Modules accepted: Orders

## 2018-10-28 NOTE — Progress Notes (Signed)
Subjective:     Sean Mejia is a 68 y.o. male and is here for a comprehensive physical exam. The patient reports no problems.  Pt mentions he is scheduled to have surgery on his R 5th digit for Dupuytren's contracture March 10th.  Pt had the procedure done in the past, but symptoms returned.   Pt states otherwise he is doing well.  He does not need any refills on meds.  Pt states he is not fasting.  Cymbalta recently refilled, good mood and energy.  Social History   Socioeconomic History  . Marital status: Married    Spouse name: Not on file  . Number of children: Not on file  . Years of education: Not on file  . Highest education level: Not on file  Occupational History  . Not on file  Social Needs  . Financial resource strain: Not on file  . Food insecurity:    Worry: Not on file    Inability: Not on file  . Transportation needs:    Medical: Not on file    Non-medical: Not on file  Tobacco Use  . Smoking status: Never Smoker  . Smokeless tobacco: Never Used  Substance and Sexual Activity  . Alcohol use: Yes    Comment: 3 times a week  . Drug use: No  . Sexual activity: Not on file  Lifestyle  . Physical activity:    Days per week: Not on file    Minutes per session: Not on file  . Stress: Not on file  Relationships  . Social connections:    Talks on phone: Not on file    Gets together: Not on file    Attends religious service: Not on file    Active member of club or organization: Not on file    Attends meetings of clubs or organizations: Not on file    Relationship status: Not on file  . Intimate partner violence:    Fear of current or ex partner: Not on file    Emotionally abused: Not on file    Physically abused: Not on file    Forced sexual activity: Not on file  Other Topics Concern  . Not on file  Social History Narrative  . Not on file   Health Maintenance  Topic Date Due  . HIV Screening  01/[birth year]/1968  . PNA vac Low Risk Adult (1 of 2 - PCV13)  01/11/2018  . TETANUS/TDAP  03/30/2021  . COLONOSCOPY  10/07/2023  . INFLUENZA VACCINE  Completed  . Hepatitis C Screening  Completed    The following portions of the patient's history were reviewed and updated as appropriate: allergies, current medications, past family history, past medical history, past social history, past surgical history and problem list.  Review of Systems Pertinent items noted in HPI and remainder of comprehensive ROS otherwise negative.   Objective:    BP 110/84 (BP Location: Left Arm, Patient Position: Sitting, Cuff Size: Normal)   Pulse 94   Temp (!) 97.5 F (36.4 C) (Oral)   Wt 181 lb (82.1 kg)   SpO2 97%   BMI 27.12 kg/m  General appearance: alert, cooperative and no distress Head: Normocephalic, without obvious abnormality, atraumatic Eyes: conjunctivae/corneas clear. PERRL, EOM's intact. Fundi benign. Ears: normal TM's and external ear canals both ears Nose: Nares normal. Septum midline. Mucosa normal. No drainage or sinus tenderness. Throat: lips, mucosa, and tongue normal; teeth and gums normal Neck: no adenopathy, no carotid bruit, no JVD, supple, symmetrical, trachea  midline and thyroid not enlarged, symmetric, no tenderness/mass/nodules Lungs: clear to auscultation bilaterally Heart: regular rate and rhythm, S1, S2 normal, no murmur, click, rub or gallop Abdomen: soft, non-tender; bowel sounds normal; no masses,  no organomegaly Extremities: extremities normal, atraumatic, no cyanosis or edema Skin: Skin color, texture, turgor normal. No rashes or lesions Lymph nodes: Cervical, supraclavicular, and axillary nodes normal. Neurologic: Alert and oriented X 3, normal strength and tone. Normal symmetric reflexes. Normal coordination and gait    Assessment:    Healthy male exam.   Plan:     Anticipatory guidance given including wearing seatbelts, smoke detectors in the home, increasing physical activity, increasing p.o. intake of water and  vegetables. -labs ordered. Will return when fasting -Prevnar 13 given this visit -Consider shingles vaccine -given handout -next CPE in 1 yr See After Visit Summary for Counseling Recommendations    HTN -continue telmisartan-hydrochlorothiazide  Screening for prostate cancer -We will obtain PSA  Screen for diabetes -We will obtain hemoglobin A1c  HLD -We will obtain lipid panel  Follow-up PRN  Abbe Amsterdam, MD

## 2018-10-28 NOTE — Patient Instructions (Signed)
Preventive Care 2 Years and Older, Male Preventive care refers to lifestyle choices and visits with your health care provider that can promote health and wellness. What does preventive care include?   A yearly physical exam. This is also called an annual well check.  Dental exams once or twice a year.  Routine eye exams. Ask your health care provider how often you should have your eyes checked.  Personal lifestyle choices, including: ? Daily care of your teeth and gums. ? Regular physical activity. ? Eating a healthy diet. ? Avoiding tobacco and drug use. ? Limiting alcohol use. ? Practicing safe sex. ? Taking low doses of aspirin every day. ? Taking vitamin and mineral supplements as recommended by your health care provider. What happens during an annual well check? The services and screenings done by your health care provider during your annual well check will depend on your age, overall health, lifestyle risk factors, and family history of disease. Counseling Your health care provider may ask you questions about your:  Alcohol use.  Tobacco use.  Drug use.  Emotional well-being.  Home and relationship well-being.  Sexual activity.  Eating habits.  History of falls.  Memory and ability to understand (cognition).  Work and work Statistician. Screening You may have the following tests or measurements:  Height, weight, and BMI.  Blood pressure.  Lipid and cholesterol levels. These may be checked every 5 years, or more frequently if you are over 9 years old.  Skin check.  Lung cancer screening. You may have this screening every year starting at age 57 if you have a 30-pack-year history of smoking and currently smoke or have quit within the past 15 years.  Colorectal cancer screening. All adults should have this screening starting at age 90 and continuing until age 69. You will have tests every 1-10 years, depending on your results and the type of screening  test. People at increased risk should start screening at an earlier age. Screening tests may include: ? Guaiac-based fecal occult blood testing. ? Fecal immunochemical test (FIT). ? Stool DNA test. ? Virtual colonoscopy. ? Sigmoidoscopy. During this test, a flexible tube with a tiny camera (sigmoidoscope) is used to examine your rectum and lower colon. The sigmoidoscope is inserted through your anus into your rectum and lower colon. ? Colonoscopy. During this test, a long, thin, flexible tube with a tiny camera (colonoscope) is used to examine your entire colon and rectum.  Prostate cancer screening. Recommendations will vary depending on your family history and other risks.  Hepatitis C blood test.  Hepatitis B blood test.  Sexually transmitted disease (STD) testing.  Diabetes screening. This is done by checking your blood sugar (glucose) after you have not eaten for a while (fasting). You may have this done every 1-3 years.  Abdominal aortic aneurysm (AAA) screening. You may need this if you are a current or former smoker.  Osteoporosis. You may be screened starting at age 30 if you are at high risk. Talk with your health care provider about your test results, treatment options, and if necessary, the need for more tests. Vaccines Your health care provider may recommend certain vaccines, such as:  Influenza vaccine. This is recommended every year.  Tetanus, diphtheria, and acellular pertussis (Tdap, Td) vaccine. You may need a Td booster every 10 years.  Varicella vaccine. You may need this if you have not been vaccinated.  Zoster vaccine. You may need this after age 42.  Measles, mumps, and rubella (MMR) vaccine.  You may need at least one dose of MMR if you were born in 1957 or later. You may also need a second dose.  Pneumococcal 13-valent conjugate (PCV13) vaccine. One dose is recommended after age 12.  Pneumococcal polysaccharide (PPSV23) vaccine. One dose is recommended  after age 36.  Meningococcal vaccine. You may need this if you have certain conditions.  Hepatitis A vaccine. You may need this if you have certain conditions or if you travel or work in places where you may be exposed to hepatitis A.  Hepatitis B vaccine. You may need this if you have certain conditions or if you travel or work in places where you may be exposed to hepatitis B.  Haemophilus influenzae type b (Hib) vaccine. You may need this if you have certain risk factors. Talk to your health care provider about which screenings and vaccines you need and how often you need them. This information is not intended to replace advice given to you by your health care provider. Make sure you discuss any questions you have with your health care provider. Document Released: 10/05/2015 Document Revised: 10/29/2017 Document Reviewed: 07/10/2015 Elsevier Interactive Patient Education  2019 [employer].  Prostate Cancer Screening  The prostate is a walnut-sized gland that is located below the bladder and in front of the rectum in males. The function of the prostate (prostate gland) is to add fluid to semen during ejaculation. Prostate cancer is the second most common type of cancer in men. A screening test for cancer is a test that is done before cancer symptoms start. Screening can help to identify cancer at an early stage, when the cancer can be treated more easily. The recommended prostate cancer screening test is a blood test called the prostate-specific antigen (PSA) test. PSA is a protein that is made in the prostate. As you age, your prostate naturally produces more PSA. Abnormally high PSA levels may be caused by:  Prostate cancer.  An enlarged prostate that is not caused by cancer (benign prostatic hyperplasia, BPH). This condition is very common in older men.  A prostate gland infection (prostatitis).  Medicines to assist with hair growth, such as finasteride. Depending on the PSA results,  you may need more tests, such as:  A physical exam to check the size of your prostate gland.  Blood and imaging tests.  A procedure to remove tissue samples from your prostate gland for testing (biopsy). Who should have screening? Screening recommendations vary based on age.  If you are younger than age 38, screening is not recommended.  If you are age 21-54 and you have no risk factors, screening is not recommended.  If you are younger than age 60, ask your health care provider if you need screening if you have one of these risk factors: ? Being of African-American descent. ? Having a family history of prostate cancer.  If you are age 81-69, talk with your health care provider about your need for screening and how often screening should be done.  If you are older than age 4, screening is not recommended. This is because the risks that screening can cause are greater than the benefits that it may provide (risks outweigh the benefits). If you are at high risk for prostate cancer, your health care provider may recommend that you have screenings more often or start screening at a younger age. You may be at high risk if you:  Are older than age 55.  Are African-American.  Have a father, brother, or  uncle who has been diagnosed with prostate cancer. The risk may be higher if your family member's cancer occurred at an early age. What are the benefits of screening? There is a small chance that screening may lower your risk of dying from prostate cancer. The chance is small because prostate cancer is typically a slow-growing cancer, and most men with prostate cancer die from a different cause. What are the risks of screening? The main risk of prostate cancer screening is diagnosing and treating prostate cancer that would never have caused any symptoms or problems (overdiagnosis and overtreatment). PSA screening cannot tell you if your PSA is high due to cancer or a different cause. A prostate  biopsy is the only procedure to diagnose prostate cancer. Even the results of a biopsy may not tell you if your cancer needs to be treated. Slow-growing prostate cancer may not need any treatment other than monitoring, so diagnosing and treating it may cause unnecessary stress or other side effects. A prostate biopsy may also cause:  Infection or fever.  A false negative. This is a result that shows that you do not have prostate cancer when you actually do have prostate cancer. Questions to ask your health care provider  When should I start prostate cancer screening?  What is my risk for prostate cancer?  How often do I need screening?  What type of screening tests do I need?  How do I get my test results?  What do my results mean?  Do I need treatment? Contact a health care provider if:  You have difficulty urinating.  You have pain when you urinate or ejaculate.  You have blood in your urine or semen.  You have pain in your back or in the area of your prostate.  You have trouble getting or maintaining an erection (erectile dysfunction, ED). Summary  Prostate cancer is a common type of cancer in men. The prostate (prostate gland) is located below the bladder and in front of the rectum. This gland adds fluid to semen during ejaculation.  Prostate cancer screening may identify cancer at an early stage, when the cancer can be treated more easily.  The prostate-specific antigen (PSA) test is the recommended screening test for prostate cancer.  Discuss the risks and benefits of prostate cancer screening with your health care provider. If you are age 66 or older, screening is likely to lead to more risks than benefits (risks outweigh the benefits). This information is not intended to replace advice given to you by your health care provider. Make sure you discuss any questions you have with your health care provider. Document Released: 06/19/2017 Document Revised: 06/19/2017  Document Reviewed: 06/19/2017 Elsevier Interactive Patient Education  2019 [employer].

## 2018-11-03 DIAGNOSIS — F330 Major depressive disorder, recurrent, mild: Secondary | ICD-10-CM | POA: Diagnosis not present

## 2018-11-05 ENCOUNTER — Other Ambulatory Visit (INDEPENDENT_AMBULATORY_CARE_PROVIDER_SITE_OTHER): Payer: BLUE CROSS/BLUE SHIELD

## 2018-11-05 DIAGNOSIS — Z131 Encounter for screening for diabetes mellitus: Secondary | ICD-10-CM | POA: Diagnosis not present

## 2018-11-05 DIAGNOSIS — E785 Hyperlipidemia, unspecified: Secondary | ICD-10-CM

## 2018-11-05 DIAGNOSIS — Z0000 Encounter for general adult medical examination without abnormal findings: Secondary | ICD-10-CM

## 2018-11-05 DIAGNOSIS — I10 Essential (primary) hypertension: Secondary | ICD-10-CM

## 2018-11-05 DIAGNOSIS — D729 Disorder of white blood cells, unspecified: Secondary | ICD-10-CM

## 2018-11-05 LAB — BASIC METABOLIC PANEL
BUN: 19 mg/dL (ref 6–23)
CO2: 28 mEq/L (ref 19–32)
Calcium: 10 mg/dL (ref 8.4–10.5)
Chloride: 104 mEq/L (ref 96–112)
Creatinine, Ser: 1.06 mg/dL (ref 0.40–1.50)
GFR: 69.94 mL/min (ref >60.00)
Glucose, Bld: 83 mg/dL (ref 70–99)
Potassium: 4.3 mEq/L (ref 3.5–5.1)
Sodium: 142 mEq/L (ref 135–145)

## 2018-11-05 LAB — CBC WITH DIFFERENTIAL/PLATELET
Basophils Absolute: 0.1 10*3/uL (ref 0.0–0.1)
Basophils Relative: 0.5 % (ref 0.0–3.0)
Eosinophils Absolute: 0.2 10*3/uL (ref 0.0–0.7)
Eosinophils Relative: 1.2 % (ref 0.0–5.0)
HCT: 43.9 % (ref 39.0–52.0)
HEMOGLOBIN: 15.2 g/dL (ref 13.0–17.0)
Lymphocytes Relative: 6.7 % — ABNORMAL LOW (ref 12.0–46.0)
Lymphs Abs: 0.9 10*3/uL (ref 0.7–4.0)
MCHC: 34.7 g/dL (ref 30.0–36.0)
MCV: 93.6 fl (ref 78.0–100.0)
Monocytes Absolute: 0.9 10*3/uL (ref 0.1–1.0)
Monocytes Relative: 6.5 % (ref 3.0–12.0)
Neutro Abs: 11.3 10*3/uL — ABNORMAL HIGH (ref 1.4–7.7)
Neutrophils Relative %: 85.1 % — ABNORMAL HIGH (ref 43.0–77.0)
Platelets: 308 10*3/uL (ref 150.0–400.0)
RBC: 4.69 Mil/uL (ref 4.22–5.81)
RDW: 12.8 % (ref 11.5–15.5)
WBC: 13.2 10*3/uL — AB (ref 4.0–10.5)

## 2018-11-05 LAB — LIPID PANEL
Cholesterol: 177 mg/dL (ref 0–200)
HDL: 56.7 mg/dL (ref >39.00)
NonHDL: 119.97
Total CHOL/HDL Ratio: 3
Triglycerides: 341 mg/dL — ABNORMAL HIGH (ref 0.0–149.0)
VLDL: 68.2 mg/dL — ABNORMAL HIGH (ref 0.0–40.0)

## 2018-11-05 LAB — LDL CHOLESTEROL, DIRECT: Direct LDL: 96 mg/dL

## 2018-11-05 LAB — HEMOGLOBIN A1C: Hgb A1c MFr Bld: 5.5 % (ref 4.6–6.5)

## 2018-11-21 ENCOUNTER — Other Ambulatory Visit: Payer: Self-pay | Admitting: Family Medicine

## 2018-11-21 DIAGNOSIS — I10 Essential (primary) hypertension: Secondary | ICD-10-CM

## 2018-11-23 ENCOUNTER — Other Ambulatory Visit (INDEPENDENT_AMBULATORY_CARE_PROVIDER_SITE_OTHER): Payer: BLUE CROSS/BLUE SHIELD

## 2018-11-23 DIAGNOSIS — E785 Hyperlipidemia, unspecified: Secondary | ICD-10-CM | POA: Diagnosis not present

## 2018-11-23 DIAGNOSIS — D2372 Other benign neoplasm of skin of left lower limb, including hip: Secondary | ICD-10-CM | POA: Diagnosis not present

## 2018-11-23 DIAGNOSIS — D729 Disorder of white blood cells, unspecified: Secondary | ICD-10-CM

## 2018-11-23 DIAGNOSIS — L814 Other melanin hyperpigmentation: Secondary | ICD-10-CM | POA: Diagnosis not present

## 2018-11-23 DIAGNOSIS — L920 Granuloma annulare: Secondary | ICD-10-CM | POA: Diagnosis not present

## 2018-11-23 DIAGNOSIS — D2262 Melanocytic nevi of left upper limb, including shoulder: Secondary | ICD-10-CM | POA: Diagnosis not present

## 2018-11-23 LAB — CBC WITH DIFFERENTIAL/PLATELET
Basophils Absolute: 0.1 10*3/uL (ref 0.0–0.1)
Basophils Relative: 2.9 % (ref 0.0–3.0)
Eosinophils Absolute: 0.1 10*3/uL (ref 0.0–0.7)
Eosinophils Relative: 3.6 % (ref 0.0–5.0)
HCT: 45.1 % (ref 39.0–52.0)
Hemoglobin: 15.3 g/dL (ref 13.0–17.0)
Lymphocytes Relative: 24.9 % (ref 12.0–46.0)
Lymphs Abs: 1 10*3/uL (ref 0.7–4.0)
MCHC: 34 g/dL (ref 30.0–36.0)
MCV: 94.8 fl (ref 78.0–100.0)
MONOS PCT: 13.2 % — AB (ref 3.0–12.0)
Monocytes Absolute: 0.5 10*3/uL (ref 0.1–1.0)
Neutro Abs: 2.1 10*3/uL (ref 1.4–7.7)
Neutrophils Relative %: 55.4 % (ref 43.0–77.0)
Platelets: 356 10*3/uL (ref 150.0–400.0)
RBC: 4.75 Mil/uL (ref 4.22–5.81)
RDW: 12.6 % (ref 11.5–15.5)
WBC: 3.9 10*3/uL — AB (ref 4.0–10.5)

## 2018-11-23 LAB — LIPID PANEL
Cholesterol: 170 mg/dL (ref 0–200)
HDL: 64.8 mg/dL (ref >39.00)
NonHDL: 105.23
Total CHOL/HDL Ratio: 3
Triglycerides: 228 mg/dL — ABNORMAL HIGH (ref 0.0–149.0)
VLDL: 45.6 mg/dL — AB (ref 0.0–40.0)

## 2018-11-23 LAB — LDL CHOLESTEROL, DIRECT: LDL DIRECT: 87 mg/dL

## 2018-11-30 DIAGNOSIS — M79644 Pain in right finger(s): Secondary | ICD-10-CM | POA: Diagnosis not present

## 2018-11-30 DIAGNOSIS — M720 Palmar fascial fibromatosis [Dupuytren]: Secondary | ICD-10-CM | POA: Diagnosis not present

## 2018-11-30 DIAGNOSIS — G8918 Other acute postprocedural pain: Secondary | ICD-10-CM | POA: Diagnosis not present

## 2018-12-08 DIAGNOSIS — M25641 Stiffness of right hand, not elsewhere classified: Secondary | ICD-10-CM | POA: Diagnosis not present

## 2018-12-13 DIAGNOSIS — M25649 Stiffness of unspecified hand, not elsewhere classified: Secondary | ICD-10-CM | POA: Diagnosis not present

## 2019-01-28 ENCOUNTER — Other Ambulatory Visit: Payer: Self-pay | Admitting: Family Medicine

## 2019-01-28 DIAGNOSIS — I10 Essential (primary) hypertension: Secondary | ICD-10-CM

## 2019-02-02 ENCOUNTER — Other Ambulatory Visit: Payer: Self-pay | Admitting: Family Medicine

## 2019-02-20 ENCOUNTER — Other Ambulatory Visit: Payer: Self-pay | Admitting: Family Medicine

## 2019-03-05 ENCOUNTER — Other Ambulatory Visit: Payer: Self-pay | Admitting: Family Medicine

## 2019-03-21 ENCOUNTER — Other Ambulatory Visit: Payer: Self-pay | Admitting: Family Medicine

## 2019-05-14 ENCOUNTER — Other Ambulatory Visit: Payer: Self-pay | Admitting: Family Medicine

## 2019-06-24 ENCOUNTER — Other Ambulatory Visit: Payer: Self-pay | Admitting: Family Medicine

## 2019-06-24 DIAGNOSIS — I10 Essential (primary) hypertension: Secondary | ICD-10-CM

## 2019-09-21 ENCOUNTER — Other Ambulatory Visit: Payer: Self-pay | Admitting: Family Medicine

## 2019-11-09 ENCOUNTER — Other Ambulatory Visit: Payer: Self-pay

## 2019-11-10 ENCOUNTER — Encounter: Payer: Medicare Other | Admitting: Family Medicine

## 2019-11-12 ENCOUNTER — Ambulatory Visit: Payer: Medicare Other | Attending: Internal Medicine

## 2019-11-12 DIAGNOSIS — Z23 Encounter for immunization: Secondary | ICD-10-CM | POA: Insufficient documentation

## 2019-11-12 NOTE — Progress Notes (Signed)
   Covid-19 Vaccination Clinic  Name:  URIAH PHILIPSON    MRN: [id number] DOB: 1951-01-12  11/12/2019  Mr. Suski was observed post Covid-19 immunization for 15 minutes without incidence. He was provided with Vaccine Information Sheet and instruction to access the V-Safe system.   Mr. Kuhl was instructed to call 911 with any severe reactions post vaccine: Marland Kitchen Difficulty breathing  . Swelling of your face and throat  . A fast heartbeat  . A bad rash all over your body  . Dizziness and weakness    Immunizations Administered    Name Date Dose VIS Date Route   Pfizer COVID-19 Vaccine 11/12/2019  1:56 PM 0.3 mL 09/02/2019 Intramuscular   Manufacturer: [employer], [employer]   Lot: [id number]   NDC: [address]-3

## 2019-11-24 ENCOUNTER — Telehealth: Payer: Self-pay | Admitting: Family Medicine

## 2019-11-24 NOTE — Telephone Encounter (Signed)
Spoke with patient. He will call insurance company to see which alternatives they will cover to help PCP make a decision.

## 2019-11-24 NOTE — Telephone Encounter (Signed)
Please advise 

## 2019-11-24 NOTE — Telephone Encounter (Signed)
Pt calling with the names of medications that [facility] will cover any of the following medications: Losartan hctz 50/12.5 mg, Irbesartan hctz 150/12.5 mg, Valsartan hctz 80/12.5 mg. Thanks

## 2019-11-24 NOTE — Telephone Encounter (Signed)
Pt is out of the medication and his insurance no longer covers his telmisartan and would like to see if a replacement can be called in today.  Pharm:  [facility]

## 2019-11-24 NOTE — Telephone Encounter (Signed)
Pt message was sent to Dr Salomon Fick for review

## 2019-11-25 ENCOUNTER — Other Ambulatory Visit: Payer: Self-pay | Admitting: Family Medicine

## 2019-11-25 DIAGNOSIS — I10 Essential (primary) hypertension: Secondary | ICD-10-CM

## 2019-11-25 MED ORDER — VALSARTAN-HYDROCHLOROTHIAZIDE 80-12.5 MG PO TABS: 1.0000 | ORAL_TABLET | Freq: Every day | ORAL | 2 refills | Status: DC

## 2019-11-25 NOTE — Telephone Encounter (Signed)
Spoke with pt verbalized understanding that his new BP prescription was sent to his pharmacy

## 2019-11-25 NOTE — Progress Notes (Signed)
Rx for Diovan-HCT 80-12.5 mg sent to pharmacy as insurance no longer covering micradis-hct 40-12.5 mg.  Pt needs to schedule in person f/u in 2-4 wks.

## 2019-11-25 NOTE — Telephone Encounter (Signed)
Pt called to check to see if Sean Mejia had time to review his message. He wanted to let her know that he has been out of the medication for 3-4 days now.   [facility] [address] [place]: [phone]

## 2019-11-25 NOTE — Telephone Encounter (Signed)
Rx for bp med sent to pharmacy.

## 2019-12-05 ENCOUNTER — Ambulatory Visit: Payer: Medicare Other | Attending: Internal Medicine

## 2019-12-05 DIAGNOSIS — Z23 Encounter for immunization: Secondary | ICD-10-CM

## 2019-12-05 NOTE — Progress Notes (Signed)
   Covid-19 Vaccination Clinic  Name:  Sean Mejia    MRN: [id number] DOB: 1951/08/23  12/05/2019  Mr. Sean Mejia was observed post Covid-19 immunization for 15 minutes without incident. He was provided with Vaccine Information Sheet and instruction to access the V-Safe system.   Mr. Sean Mejia was instructed to call 911 with any severe reactions post vaccine: Marland Kitchen Difficulty breathing  . Swelling of face and throat  . A fast heartbeat  . A bad rash all over body  . Dizziness and weakness   Immunizations Administered    Name Date Dose VIS Date Route   Pfizer COVID-19 Vaccine 12/05/2019 10:05 AM 0.3 mL 09/02/2019 Intramuscular   Manufacturer: [employer], [employer]   Lot: [id number]   NDC: [address]-1

## 2019-12-21 ENCOUNTER — Other Ambulatory Visit: Payer: Self-pay | Admitting: Family Medicine

## 2019-12-21 ENCOUNTER — Other Ambulatory Visit: Payer: Self-pay

## 2019-12-22 ENCOUNTER — Encounter: Payer: Self-pay | Admitting: Family Medicine

## 2019-12-22 ENCOUNTER — Other Ambulatory Visit: Payer: Self-pay | Admitting: Family Medicine

## 2019-12-22 ENCOUNTER — Ambulatory Visit (INDEPENDENT_AMBULATORY_CARE_PROVIDER_SITE_OTHER): Payer: Medicare Other | Admitting: Family Medicine

## 2019-12-22 VITALS — BP 146/98 | HR 77 | Temp 98.1°F | Ht 69.0 in | Wt 180.6 lb

## 2019-12-22 DIAGNOSIS — I10 Essential (primary) hypertension: Secondary | ICD-10-CM | POA: Diagnosis not present

## 2019-12-22 DIAGNOSIS — Z0001 Encounter for general adult medical examination with abnormal findings: Secondary | ICD-10-CM

## 2019-12-22 DIAGNOSIS — E781 Pure hyperglyceridemia: Secondary | ICD-10-CM | POA: Diagnosis not present

## 2019-12-22 DIAGNOSIS — Z125 Encounter for screening for malignant neoplasm of prostate: Secondary | ICD-10-CM | POA: Diagnosis not present

## 2019-12-22 DIAGNOSIS — Z0000 Encounter for general adult medical examination without abnormal findings: Secondary | ICD-10-CM

## 2019-12-22 DIAGNOSIS — Z23 Encounter for immunization: Secondary | ICD-10-CM

## 2019-12-22 LAB — BASIC METABOLIC PANEL
BUN: 17 mg/dL (ref 6–23)
CO2: 28 mEq/L (ref 19–32)
Calcium: 10.1 mg/dL (ref 8.4–10.5)
Chloride: 104 mEq/L (ref 96–112)
Creatinine, Ser: 0.98 mg/dL (ref 0.40–1.50)
GFR: 76.3 mL/min (ref >60.00)
Glucose, Bld: 81 mg/dL (ref 70–99)
Potassium: 4.6 mEq/L (ref 3.5–5.1)
Sodium: 140 mEq/L (ref 135–145)

## 2019-12-22 LAB — CBC
HCT: 43.8 % (ref 39.0–52.0)
Hemoglobin: 14.9 g/dL (ref 13.0–17.0)
MCHC: 34 g/dL (ref 30.0–36.0)
MCV: 95.7 fl (ref 78.0–100.0)
Platelets: 319 10*3/uL (ref 150.0–400.0)
RBC: 4.58 Mil/uL (ref 4.22–5.81)
RDW: 13 % (ref 11.5–15.5)
WBC: 4.3 10*3/uL (ref 4.0–10.5)

## 2019-12-22 LAB — LDL CHOLESTEROL, DIRECT: Direct LDL: 73 mg/dL

## 2019-12-22 LAB — LIPID PANEL
Cholesterol: 157 mg/dL (ref 0–200)
HDL: 49.4 mg/dL (ref >39.00)
NonHDL: 108.04
Total CHOL/HDL Ratio: 3
Triglycerides: 251 mg/dL — ABNORMAL HIGH (ref 0.0–149.0)
VLDL: 50.2 mg/dL — ABNORMAL HIGH (ref 0.0–40.0)

## 2019-12-22 LAB — PSA, MEDICARE: PSA: 0.34 ng/ml (ref 0.10–4.00)

## 2019-12-22 MED ORDER — OLMESARTAN MEDOXOMIL-HCTZ 40-25 MG PO TABS: 1.0000 | ORAL_TABLET | Freq: Every day | ORAL | 3 refills | Status: DC

## 2019-12-22 NOTE — Progress Notes (Signed)
Subjective:    Sean Mejia is a 69 y.o. male who presents for Medicare Annual/Subsequent preventive examination.   Preventive Screening-Counseling & Management  Tobacco Social History   Tobacco Use  Smoking Status Never Smoker  Smokeless Tobacco Never Used    Problems Prior to Visit 1. HTN:  Taking valsartan-hctz 80-12.5 mg daily.  Previously on benicar 40-25 mg without issue.  Had to change 2/2 insurance preference.  Now with elevated bp in am 140s/100s and low bp in pm 110/70s.  Denies HAs, CP, changes in vision. 2. HLD:  Taking zocor 40 mg daily.  Denies myalgias.  Eating fish several times per week and walking dog 1 hour/day.  [facility] 3.  Patient received both doses of COVID-19 vaccine. 4.  Had recent eye exam. 5.  Pt had surgery for Dupuytren's contracture on bilateral fifth digits.  Pt notes shortly after procedure edema and contracture return.  Pt has yet to follow-up with Ortho.  Current Problems (verified) Patient Active Problem List   Diagnosis Date Noted  . GERD (gastroesophageal reflux disease) 05/31/2014  . Other testicular hypofunction 12/18/2011  . Dyslipidemia 05/16/2009  . DECREASED LIBIDO 05/16/2009  . TEMPOROMANDIBULAR JOINT DISORDER 01/15/2009  . THROMBOCYTOSIS 06/16/2008  . DEPRESSION 05/26/2007  . Essential hypertension 05/26/2007    Medications Prior to Visit Current Outpatient Medications on File Prior to Visit  Medication Sig Dispense Refill  . DULoxetine (CYMBALTA) 60 MG capsule TAKE 1 CAPSULE (60 MG TOTAL) BY MOUTH 2 (TWO) TIMES DAILY. 60 capsule 0  . Multiple Vitamins-Minerals (MULTIVITAMIN PO) Take by mouth daily.    . simvastatin (ZOCOR) 40 MG tablet TAKE 1 TABLET BY MOUTH EVERYDAY AT BEDTIME 90 tablet 1  . valsartan-hydrochlorothiazide (DIOVAN-HCT) 80-12.5 MG tablet Take 1 tablet by mouth daily. 30 tablet 2   No current facility-administered medications on file prior to visit.    Current Medications  (verified) Current Outpatient Medications  Medication Sig Dispense Refill  . DULoxetine (CYMBALTA) 60 MG capsule TAKE 1 CAPSULE (60 MG TOTAL) BY MOUTH 2 (TWO) TIMES DAILY. 60 capsule 0  . Multiple Vitamins-Minerals (MULTIVITAMIN PO) Take by mouth daily.    Marland Kitchen omeprazole (PRILOSEC) 40 MG capsule TAKE 1 CAPSULE (40 MG TOTAL) BY MOUTH EVERY OTHER DAY. 45 capsule 1  . simvastatin (ZOCOR) 40 MG tablet TAKE 1 TABLET BY MOUTH EVERYDAY AT BEDTIME 90 tablet 1  . valsartan-hydrochlorothiazide (DIOVAN-HCT) 80-12.5 MG tablet Take 1 tablet by mouth daily. 30 tablet 2   No current facility-administered medications for this visit.     Allergies (verified) Amoxicillin and Penicillins   PAST HISTORY  Family History Family History  Problem Relation Age of Onset  . COPD Mother   . Lupus Mother   . Heart attack Father   . Cancer Brother        bladder and liver  . Colon cancer Neg Hx   . Esophageal cancer Neg Hx   . Stomach cancer Neg Hx   . Rectal cancer Neg Hx     Social History Social History   Tobacco Use  . Smoking status: Never Smoker  . Smokeless tobacco: Never Used  Substance Use Topics  . Alcohol use: Yes    Comment: 3 times a week    Are there smokers in your home (other than you)?  No  Risk Factors Current exercise habits: Home exercise routine includes walking 1 hrs per day.  Dietary issues discussed: balanced diet   Cardiac risk factors: advanced age (older  than 55 for men, 65 for women), dyslipidemia, hypertension and male gender.  Depression Screen (Note: if answer to either of the following is "Yes", a more complete depression screening is indicated)   Q1: Over the past two weeks, have you felt down, depressed or hopeless? No  Q2: Over the past two weeks, have you felt little interest or pleasure in doing things? No  Have you lost interest or pleasure in daily life? No  Do you often feel hopeless? No  Do you cry easily over simple problems? No  Activities of Daily  Living In your present state of health, do you have any difficulty performing the following activities?:  Driving? No Managing money?  No Feeding yourself? No Getting from bed to chair? No Climbing a flight of stairs? No Preparing food and eating?: No Bathing or showering? No Getting dressed: No Getting to the toilet? No Using the toilet:No Moving around from place to place: No In the past year have you fallen or had a near fall?:No  Hearing Difficulties: No Do you often ask people to speak up or repeat themselves? No Do you experience ringing or noises in your ears? No Do you have difficulty understanding soft or whispered voices? No   Do you feel that you have a problem with memory? No  Do you often misplace items? No  Do you feel safe at home?  Yes  Cognitive Testing  Alert? Yes  Normal Appearance?Yes  Oriented to person? Yes  Place? Yes   Time? Yes  Recall of three objects?  Yes  Can perform simple calculations? Yes  Displays appropriate judgment?Yes  Can read the correct time from a watch face?Yes   Advanced Directives have been discussed with the patient? Yes   List the Names of Other Physician/Practitioners you currently use: 1.  Psychiatry-Gerald Plovsky 2.  [place] surgeon  Indicate any recent Medical Services you may have received from other than [facility] providers in the past year (date may be approximate).  Immunization History  Administered Date(s) Administered  . Fluad Quad(high Dose 65+) 06/26/2019  . Influenza Split 06/22/2013  . Influenza Whole 06/16/2008  . Influenza, High Dose Seasonal PF 06/26/2019  . Influenza,inj,Quad PF,6+ Mos 07/01/2014, 09/07/2015  . Influenza-Unspecified 11/05/2016  . PFIZER SARS-COV-2 Vaccination 11/12/2019, 12/05/2019  . Pneumococcal Conjugate-13 10/28/2018  . Tdap 03/31/2011  . Zoster Recombinat (Shingrix) 04/22/2019, 09/06/2019    Screening Tests Health Maintenance  Topic Date Due  . PNA vac Low  Risk Adult (2 of 2 - PPSV23) 10/29/2019  . INFLUENZA VACCINE  04/22/2020  . TETANUS/TDAP  03/30/2021  . COLONOSCOPY  10/07/2023  . Hepatitis C Screening  Completed    All answers were reviewed with the patient and necessary referrals were made:  Deeann Saint, MD   12/22/2019   History reviewed: allergies, current medications, past family history, past medical history, past social history, past surgical history and problem list  Review of Systems A comprehensive review of systems was negative.    Objective:      Blood pressure (!) 146/98, pulse 77, temperature 98.1 F (36.7 C), temperature source Temporal, height 5\' 9"  (1.753 m), weight 180 lb 9.6 oz (81.9 kg), SpO2 98 %. Body mass index is 26.67 kg/m.  BP (!) 146/98 (BP Location: Right Arm, Patient Position: Sitting, Cuff Size: Normal)   Pulse 77   Temp 98.1 F (36.7 C) (Temporal)   Ht 5\' 9"  (1.753 m)   Wt 180 lb 9.6 oz (81.9 kg)  SpO2 98%   BMI 26.67 kg/m  General appearance: alert, cooperative and no distress Head: Normocephalic, without obvious abnormality, atraumatic Eyes: conjunctivae/corneas clear. PERRL, EOM's intact. Fundi benign. Ears: normal TM's and external ear canals both ears Nose: Nares normal. Septum midline. Mucosa normal. No drainage or sinus tenderness. Throat: lips, mucosa, and tongue normal; teeth and gums normal Neck: no adenopathy, no carotid bruit, no JVD, supple, symmetrical, trachea midline and thyroid not enlarged, symmetric, no tenderness/mass/nodules Lungs: clear to auscultation bilaterally Heart: regular rate and rhythm, S1, S2 normal, no murmur, click, rub or gallop Abdomen: soft, non-tender; bowel sounds normal; no masses,  no organomegaly Extremities: extremities normal, atraumatic, no cyanosis or edema Pulses: 2+ and symmetric Skin: Skin color, texture, turgor normal. No rashes or lesions Lymph nodes: Cervical, supraclavicular, and axillary nodes normal. Neurologic: Alert and  oriented X 3, normal strength and tone. Normal symmetric reflexes. Normal coordination and gait     Assessment:     Pt is a 69 yo male seen for subsequent AWV and follow-up on chronic conditions.     Plan:      Essential hypertension  -Elevated -Discussed switching back to olmesartan-hydrochlorothiazide 40-25 mg daily as worked well for patient in the past.  May require PA -We will d/c valsartan-HCTZ 80-12.5 mg -Continue lifestyle modifications.  Patient advised to keep a log of BP and bring to next OV. - Plan: CBC (no diff), Basic metabolic panel, olmesartan-hydrochlorothiazide (BENICAR HCT) 40-25 MG tablet  Pure hypertriglyceridemia  -Continue lifestyle modifications -Continue Zocor 40 mg daily - Plan: Lipid panel  Screening for prostate cancer  - Plan: PSA, Medicare  Medicare annual wellness visit, subsequent  - Plan: PSA, Medicare  Need for Streptococcus pneumoniae vaccination  - Plan: Pneumococcal polysaccharide vaccine 23-valent greater than or equal to 2yo subcutaneous/IM   During the course of the visit the patient was educated and counseled about appropriate screening and preventive services including:    Pneumococcal vaccine   Prostate cancer screening  Diabetes screening  Nutrition counseling   Advanced directives: has an advanced directive - a copy HAS NOT been provided.  Diet review for nutrition referral? Yes ____  Not Indicated _X_   Patient Instructions (the written plan) was given to the patient.  Medicare Attestation I have personally reviewed: The patient's medical and social history Their use of alcohol, tobacco or illicit drugs Their current medications and supplements The patient's functional ability including ADLs,fall risks, home safety risks, cognitive, and hearing and visual impairment Diet and physical activities Evidence for depression or mood disorders  The patient's weight, height, BMI, and visual acuity have been recorded in  the chart.  I have made referrals, counseling, and provided education to the patient based on review of the above and I have provided the patient with a written personalized care plan for preventive services.     Deeann Saint, MD   12/22/2019

## 2019-12-22 NOTE — Patient Instructions (Addendum)
Preventive Care 89 Years and Older, Male Preventive care refers to lifestyle choices and visits with your health care provider that can promote health and wellness. This includes:  A yearly physical exam. This is also called an annual well check.  Regular dental and eye exams.  Immunizations.  Screening for certain conditions.  Healthy lifestyle choices, such as diet and exercise. What can I expect for my preventive care visit? Physical exam Your health care provider will check:  Height and weight. These may be used to calculate body mass index (BMI), which is a measurement that tells if you are at a healthy weight.  Heart rate and blood pressure.  Your skin for abnormal spots. Counseling Your health care provider may ask you questions about:  Alcohol, tobacco, and drug use.  Emotional well-being.  Home and relationship well-being.  Sexual activity.  Eating habits.  History of falls.  Memory and ability to understand (cognition).  Work and work Statistician. What immunizations do I need?  Influenza (flu) vaccine  This is recommended every year. Tetanus, diphtheria, and pertussis (Tdap) vaccine  You may need a Td booster every 10 years. Varicella (chickenpox) vaccine  You may need this vaccine if you have not already been vaccinated. Zoster (shingles) vaccine  You may need this after age 70. Pneumococcal conjugate (PCV13) vaccine  One dose is recommended after age 40. Pneumococcal polysaccharide (PPSV23) vaccine  One dose is recommended after age 24. Measles, mumps, and rubella (MMR) vaccine  You may need at least one dose of MMR if you were born in 1957 or later. You may also need a second dose. Meningococcal conjugate (MenACWY) vaccine  You may need this if you have certain conditions. Hepatitis A vaccine  You may need this if you have certain conditions or if you travel or work in places where you may be exposed to hepatitis A. Hepatitis B  vaccine  You may need this if you have certain conditions or if you travel or work in places where you may be exposed to hepatitis B. Haemophilus influenzae type b (Hib) vaccine  You may need this if you have certain conditions. You may receive vaccines as individual doses or as more than one vaccine together in one shot (combination vaccines). Talk with your health care provider about the risks and benefits of combination vaccines. What tests do I need? Blood tests  Lipid and cholesterol levels. These may be checked every 5 years, or more frequently depending on your overall health.  Hepatitis C test.  Hepatitis B test. Screening  Lung cancer screening. You may have this screening every year starting at age 67 if you have a 30-pack-year history of smoking and currently smoke or have quit within the past 15 years.  Colorectal cancer screening. All adults should have this screening starting at age 77 and continuing until age 8. Your health care provider may recommend screening at age 74 if you are at increased risk. You will have tests every 1-10 years, depending on your results and the type of screening test.  Prostate cancer screening. Recommendations will vary depending on your family history and other risks.  Diabetes screening. This is done by checking your blood sugar (glucose) after you have not eaten for a while (fasting). You may have this done every 1-3 years.  Abdominal aortic aneurysm (AAA) screening. You may need this if you are a current or former smoker.  Sexually transmitted disease (STD) testing. Follow these instructions at home: Eating and drinking  Eat  a diet that includes fresh fruits and vegetables, whole grains, lean protein, and low-fat dairy products. Limit your intake of foods with high amounts of sugar, saturated fats, and salt.  Take vitamin and mineral supplements as recommended by your health care provider.  Do not drink alcohol if your health care  provider tells you not to drink.  If you drink alcohol: ? Limit how much you have to 0-2 drinks a day. ? Be aware of how much alcohol is in your drink. In the [place] one drink equals one 12 oz bottle of beer (355 mL), one 5 oz glass of wine (148 mL), or one 1 oz glass of hard liquor (44 mL). Lifestyle  Take daily care of your teeth and gums.  Stay active. Exercise for at least 30 minutes on 5 or more days each week.  Do not use any products that contain nicotine or tobacco, such as cigarettes, e-cigarettes, and chewing tobacco. If you need help quitting, ask your health care provider.  If you are sexually active, practice safe sex. Use a condom or other form of protection to prevent STIs (sexually transmitted infections).  Talk with your health care provider about taking a low-dose aspirin or statin. What's next?  Visit your health care provider once a year for a well check visit.  Ask your health care provider how often you should have your eyes and teeth checked.  Stay up to date on all vaccines. This information is not intended to replace advice given to you by your health care provider. Make sure you discuss any questions you have with your health care provider. Document Revised: 09/02/2018 Document Reviewed: 09/02/2018 Elsevier Patient Education  2020 [employer].  Hypertension, Adult Hypertension is another name for high blood pressure. High blood pressure forces your heart to work harder to pump blood. This can cause problems over time. There are two numbers in a blood pressure reading. There is a top number (systolic) over a bottom number (diastolic). It is best to have a blood pressure that is below 120/80. Healthy choices can help lower your blood pressure, or you may need medicine to help lower it. What are the causes? The cause of this condition is not known. Some conditions may be related to high blood pressure. What increases the risk?  Smoking.  Having type 2  diabetes mellitus, high cholesterol, or both.  Not getting enough exercise or physical activity.  Being overweight.  Having too much fat, sugar, calories, or salt (sodium) in your diet.  Drinking too much alcohol.  Having long-term (chronic) kidney disease.  Having a family history of high blood pressure.  Age. Risk increases with age.  Race. You may be at higher risk if you are African American.  Gender. Men are at higher risk than women before age 61. After age 34, women are at higher risk than men.  Having obstructive sleep apnea.  Stress. What are the signs or symptoms?  High blood pressure may not cause symptoms. Very high blood pressure (hypertensive crisis) may cause: ? Headache. ? Feelings of worry or nervousness (anxiety). ? Shortness of breath. ? Nosebleed. ? A feeling of being sick to your stomach (nausea). ? Throwing up (vomiting). ? Changes in how you see. ? Very bad chest pain. ? Seizures. How is this treated?  This condition is treated by making healthy lifestyle changes, such as: ? Eating healthy foods. ? Exercising more. ? Drinking less alcohol.  Your health care provider may prescribe medicine if lifestyle  changes are not enough to get your blood pressure under control, and if: ? Your top number is above 130. ? Your bottom number is above 80.  Your personal target blood pressure may vary. Follow these instructions at home: Eating and drinking   If told, follow the DASH eating plan. To follow this plan: ? Fill one half of your plate at each meal with fruits and vegetables. ? Fill one fourth of your plate at each meal with whole grains. Whole grains include whole-wheat pasta, brown rice, and whole-grain bread. ? Eat or drink low-fat dairy products, such as skim milk or low-fat yogurt. ? Fill one fourth of your plate at each meal with low-fat (lean) proteins. Low-fat proteins include fish, chicken without skin, eggs, beans, and tofu. ? Avoid  fatty meat, cured and processed meat, or chicken with skin. ? Avoid pre-made or processed food.  Eat less than 1,500 mg of salt each day.  Do not drink alcohol if: ? Your doctor tells you not to drink. ? You are pregnant, may be pregnant, or are planning to become pregnant.  If you drink alcohol: ? Limit how much you use to:  0-1 drink a day for women.  0-2 drinks a day for men. ? Be aware of how much alcohol is in your drink. In the [place] one drink equals one 12 oz bottle of beer (355 mL), one 5 oz glass of wine (148 mL), or one 1 oz glass of hard liquor (44 mL). Lifestyle   Work with your doctor to stay at a healthy weight or to lose weight. Ask your doctor what the best weight is for you.  Get at least 30 minutes of exercise most days of the week. This may include walking, swimming, or biking.  Get at least 30 minutes of exercise that strengthens your muscles (resistance exercise) at least 3 days a week. This may include lifting weights or doing Pilates.  Do not use any products that contain nicotine or tobacco, such as cigarettes, e-cigarettes, and chewing tobacco. If you need help quitting, ask your doctor.  Check your blood pressure at home as told by your doctor.  Keep all follow-up visits as told by your doctor. This is important. Medicines  Take over-the-counter and prescription medicines only as told by your doctor. Follow directions carefully.  Do not skip doses of blood pressure medicine. The medicine does not work as well if you skip doses. Skipping doses also puts you at risk for problems.  Ask your doctor about side effects or reactions to medicines that you should watch for. Contact a doctor if you:  Think you are having a reaction to the medicine you are taking.  Have headaches that keep coming back (recurring).  Feel dizzy.  Have swelling in your ankles.  Have trouble with your vision. Get help right away if you:  Get a very bad headache.  Start  to feel mixed up (confused).  Feel weak or numb.  Feel faint.  Have very bad pain in your: ? Chest. ? Belly (abdomen).  Throw up more than once.  Have trouble breathing. Summary  Hypertension is another name for high blood pressure.  High blood pressure forces your heart to work harder to pump blood.  For most people, a normal blood pressure is less than 120/80.  Making healthy choices can help lower blood pressure. If your blood pressure does not get lower with healthy choices, you may need to take medicine. This information is not  intended to replace advice given to you by your health care provider. Make sure you discuss any questions you have with your health care provider. Document Revised: 05/19/2018 Document Reviewed: 05/19/2018 Elsevier Patient Education  [place].  Dyslipidemia Dyslipidemia is an imbalance of waxy, fat-like substances (lipids) in the blood. The body needs lipids in small amounts. Dyslipidemia often involves a high level of cholesterol or triglycerides, which are types of lipids. Common forms of dyslipidemia include:  High levels of LDL cholesterol. LDL is the type of cholesterol that causes fatty deposits (plaques) to build up in the blood vessels that carry blood away from your heart (arteries).  Low levels of HDL cholesterol. HDL cholesterol is the type of cholesterol that protects against heart disease. High levels of HDL remove the LDL buildup from arteries.  High levels of triglycerides. Triglycerides are a fatty substance in the blood that is linked to a buildup of plaques in the arteries. What are the causes? Primary dyslipidemia is caused by changes (mutations) in genes that are passed down through families (inherited). These mutations cause several types of dyslipidemia. Secondary dyslipidemia is caused by lifestyle choices and diseases that lead to dyslipidemia, such as:  Eating a diet that is high in animal fat.  Not getting  enough exercise.  Having diabetes, kidney disease, liver disease, or thyroid disease.  Drinking large amounts of alcohol.  Using certain medicines. What increases the risk? You are more likely to develop this condition if you are an older man or if you are a woman who has gone through menopause. Other risk factors include:  Having a family history of dyslipidemia.  Taking certain medicines, including birth control pills, steroids, some diuretics, and beta-blockers.  Smoking cigarettes.  Eating a high-fat diet.  Having certain medical conditions such as diabetes, polycystic ovary syndrome (PCOS), kidney disease, liver disease, or hypothyroidism.  Not exercising regularly.  Being overweight or obese with too much belly fat. What are the signs or symptoms? In most cases, dyslipidemia does not usually cause any symptoms. In severe cases, very high lipid levels can cause:  Fatty bumps under the skin (xanthomas).  White or gray ring around the black center (pupil) of the eye. Very high triglyceride levels can cause inflammation of the pancreas (pancreatitis). How is this diagnosed? Your health care provider may diagnose dyslipidemia based on a routine blood test (fasting blood test). Because most people do not have symptoms of the condition, this blood testing (lipid profile) is done on adults age 46 and older and is repeated every 5 years. This test checks:  Total cholesterol. This measures the total amount of cholesterol in your blood, including LDL cholesterol, HDL cholesterol, and triglycerides. A healthy number is below 200.  LDL cholesterol. The target number for LDL cholesterol is different for each person, depending on individual risk factors. Ask your health care provider what your LDL cholesterol should be.  HDL cholesterol. An HDL level of 60 or higher is best because it helps to protect against heart disease. A number below 15 for men or below 66 for women increases the  risk for heart disease.  Triglycerides. A healthy triglyceride number is below 150. If your lipid profile is abnormal, your health care provider may do other blood tests. How is this treated? Treatment depends on the type of dyslipidemia that you have and your other risk factors for heart disease and stroke. Your health care provider will have a target range for your lipid levels based on this  information. For many people, this condition may be treated by lifestyle changes, such as diet and exercise. Your health care provider may recommend that you:  Get regular exercise.  Make changes to your diet.  Quit smoking if you smoke. If diet changes and exercise do not help you reach your goals, your health care provider may also prescribe medicine to lower lipids. The most commonly prescribed type of medicine lowers your LDL cholesterol (statin drug). If you have a high triglyceride level, your provider may prescribe another type of drug (fibrate) or an omega-3 fish oil supplement, or both. Follow these instructions at home:  Eating and drinking  Follow instructions from your health care provider or dietitian about eating or drinking restrictions.  Eat a healthy diet as told by your health care provider. This can help you reach and maintain a healthy weight, lower your LDL cholesterol, and raise your HDL cholesterol. This may include: ? Limiting your calories, if you are overweight. ? Eating more fruits, vegetables, whole grains, fish, and lean meats. ? Limiting saturated fat, trans fat, and cholesterol.  If you drink alcohol: ? Limit how much you use. ? Be aware of how much alcohol is in your drink. In the [place] one drink equals one 12 oz bottle of beer (355 mL), one 5 oz glass of wine (148 mL), or one 1 oz glass of hard liquor (44 mL).  Do not drink alcohol if: ? Your health care provider tells you not to drink. ? You are pregnant, may be pregnant, or are planning to become  pregnant. Activity  Get regular exercise. Start an exercise and strength training program as told by your health care provider. Ask your health care provider what activities are safe for you. Your health care provider may recommend: ? 30 minutes of aerobic activity 4-6 days a week. Brisk walking is an example of aerobic activity. ? Strength training 2 days a week. General instructions  Do not use any products that contain nicotine or tobacco, such as cigarettes, e-cigarettes, and chewing tobacco. If you need help quitting, ask your health care provider.  Take over-the-counter and prescription medicines only as told by your health care provider. This includes supplements.  Keep all follow-up visits as told by your health care provider. Contact a health care provider if:  You are: ? Having trouble sticking to your exercise or diet plan. ? Struggling to quit smoking or control your use of alcohol. Summary  Dyslipidemia often involves a high level of cholesterol or triglycerides, which are types of lipids.  Treatment depends on the type of dyslipidemia that you have and your other risk factors for heart disease and stroke.  For many people, treatment starts with lifestyle changes, such as diet and exercise.  Your health care provider may prescribe medicine to lower lipids. This information is not intended to replace advice given to you by your health care provider. Make sure you discuss any questions you have with your health care provider. Document Revised: 05/03/2018 Document Reviewed: 04/09/2018 Elsevier Patient Education  [place].

## 2020-02-09 ENCOUNTER — Other Ambulatory Visit: Payer: Self-pay | Admitting: Family Medicine

## 2020-02-15 ENCOUNTER — Other Ambulatory Visit: Payer: Self-pay

## 2020-02-15 DIAGNOSIS — I10 Essential (primary) hypertension: Secondary | ICD-10-CM

## 2020-02-15 MED ORDER — OLMESARTAN MEDOXOMIL-HCTZ 40-25 MG PO TABS: 1.0000 | ORAL_TABLET | Freq: Every day | ORAL | 3 refills | Status: DC

## 2020-02-15 MED ORDER — SIMVASTATIN 40 MG PO TABS: ORAL_TABLET | ORAL | 0 refills | Status: DC

## 2020-02-15 MED ORDER — OMEPRAZOLE 40 MG PO CPDR: 40.0000 mg | DELAYED_RELEASE_CAPSULE | ORAL | 1 refills | Status: DC

## 2020-02-15 MED ORDER — DULOXETINE HCL 60 MG PO CPEP: ORAL_CAPSULE | ORAL | 0 refills | Status: DC

## 2020-02-15 NOTE — Telephone Encounter (Signed)
[facility] sent refill request to have all medications sent to their pharmacy due to pharmacy change. I called pt who confirmed that he no longer wants to use [facility]. Talked to PCP CMA who advised this was ok to do for 90 days. Pt notified of update.

## 2020-02-21 ENCOUNTER — Telehealth: Payer: Self-pay | Admitting: Family Medicine

## 2020-02-21 NOTE — Telephone Encounter (Signed)
FYI Spoke with pt state that he is checking his bp in the morning and evenings, pt state that th AM readings are around 120/80 and in the PM the readings are as low as 75/45. Pt denies having any dizziness, blurr vision headache or any other symptoms related to Hypotension, pt advised to monitor and record the BP throughout today and to call our office back with the readings for an office visit tomorrow since all providers are fully booked, Pt advised to go to the nearest ED if he develops any hypotension symptoms.

## 2020-02-21 NOTE — Telephone Encounter (Signed)
Patient had some med changes and now BP is running low.  Patient is requesting a call back.

## 2020-02-28 ENCOUNTER — Other Ambulatory Visit: Payer: Self-pay | Admitting: Family Medicine

## 2020-02-28 DIAGNOSIS — I10 Essential (primary) hypertension: Secondary | ICD-10-CM

## 2020-03-29 ENCOUNTER — Other Ambulatory Visit: Payer: Self-pay | Admitting: Family Medicine

## 2020-05-23 ENCOUNTER — Telehealth: Payer: Self-pay | Admitting: Family Medicine

## 2020-05-23 NOTE — Telephone Encounter (Signed)
Pt is wanting to know if he can get the COVID booster?   Pt can be reached at [phone]

## 2020-05-24 NOTE — Telephone Encounter (Signed)
Left detailed message for pt advising to contact his local pharmacy[facility] for COVID 19- Booster

## 2020-09-18 ENCOUNTER — Telehealth: Payer: Self-pay | Admitting: Family Medicine

## 2020-09-18 NOTE — Telephone Encounter (Signed)
pt requesting a refill on he is out  simvastatin (ZOCOR) 40 MG tablet   [id number] - [place], [place] - [address] AT [facility] [address]  Phone:  [phone] Fax:  [phone]

## 2020-09-20 ENCOUNTER — Other Ambulatory Visit: Payer: Self-pay

## 2020-09-20 MED ORDER — SIMVASTATIN 40 MG PO TABS: ORAL_TABLET | ORAL | 0 refills | Status: DC

## 2020-10-02 ENCOUNTER — Other Ambulatory Visit: Payer: Self-pay

## 2020-10-02 MED ORDER — SIMVASTATIN 40 MG PO TABS: ORAL_TABLET | ORAL | 0 refills | Status: DC

## 2020-11-06 ENCOUNTER — Other Ambulatory Visit: Payer: Self-pay | Admitting: Family Medicine

## 2020-11-06 DIAGNOSIS — I10 Essential (primary) hypertension: Secondary | ICD-10-CM

## 2020-11-26 ENCOUNTER — Other Ambulatory Visit: Payer: Self-pay | Admitting: Family Medicine

## 2020-12-21 ENCOUNTER — Other Ambulatory Visit: Payer: Self-pay | Admitting: Family Medicine

## 2020-12-24 ENCOUNTER — Ambulatory Visit (INDEPENDENT_AMBULATORY_CARE_PROVIDER_SITE_OTHER): Payer: Medicare Other | Admitting: Family Medicine

## 2020-12-24 ENCOUNTER — Other Ambulatory Visit: Payer: Self-pay

## 2020-12-24 ENCOUNTER — Encounter: Payer: Self-pay | Admitting: Family Medicine

## 2020-12-24 VITALS — BP 122/82 | HR 78 | Temp 97.6°F | Ht 68.25 in | Wt 185.0 lb

## 2020-12-24 DIAGNOSIS — N529 Male erectile dysfunction, unspecified: Secondary | ICD-10-CM

## 2020-12-24 DIAGNOSIS — F1020 Alcohol dependence, uncomplicated: Secondary | ICD-10-CM | POA: Diagnosis not present

## 2020-12-24 DIAGNOSIS — R200 Anesthesia of skin: Secondary | ICD-10-CM | POA: Diagnosis not present

## 2020-12-24 DIAGNOSIS — E782 Mixed hyperlipidemia: Secondary | ICD-10-CM

## 2020-12-24 DIAGNOSIS — R202 Paresthesia of skin: Secondary | ICD-10-CM | POA: Diagnosis not present

## 2020-12-24 DIAGNOSIS — Z125 Encounter for screening for malignant neoplasm of prostate: Secondary | ICD-10-CM

## 2020-12-24 DIAGNOSIS — Z0001 Encounter for general adult medical examination with abnormal findings: Secondary | ICD-10-CM

## 2020-12-24 DIAGNOSIS — Z0000 Encounter for general adult medical examination without abnormal findings: Secondary | ICD-10-CM

## 2020-12-24 DIAGNOSIS — I10 Essential (primary) hypertension: Secondary | ICD-10-CM

## 2020-12-24 LAB — CBC WITH DIFFERENTIAL/PLATELET
Basophils Absolute: 0.1 10*3/uL (ref 0.0–0.1)
Basophils Relative: 1.3 % (ref 0.0–3.0)
Eosinophils Absolute: 0.1 10*3/uL (ref 0.0–0.7)
Eosinophils Relative: 3.3 % (ref 0.0–5.0)
HCT: 39.9 % (ref 39.0–52.0)
Hemoglobin: 13.7 g/dL (ref 13.0–17.0)
Lymphocytes Relative: 18.3 % (ref 12.0–46.0)
Lymphs Abs: 0.8 10*3/uL (ref 0.7–4.0)
MCHC: 34.4 g/dL (ref 30.0–36.0)
MCV: 95.9 fl (ref 78.0–100.0)
Monocytes Absolute: 0.5 10*3/uL (ref 0.1–1.0)
Monocytes Relative: 11.5 % (ref 3.0–12.0)
Neutro Abs: 2.7 10*3/uL (ref 1.4–7.7)
Neutrophils Relative %: 65.6 % (ref 43.0–77.0)
Platelets: 296 10*3/uL (ref 150.0–400.0)
RBC: 4.16 Mil/uL — ABNORMAL LOW (ref 4.22–5.81)
RDW: 13.1 % (ref 11.5–15.5)
WBC: 4.2 10*3/uL (ref 4.0–10.5)

## 2020-12-24 LAB — LIPID PANEL
Cholesterol: 157 mg/dL (ref 0–200)
HDL: 55.6 mg/dL (ref >39.00)
NonHDL: 101.74
Total CHOL/HDL Ratio: 3
Triglycerides: 270 mg/dL — ABNORMAL HIGH (ref 0.0–149.0)
VLDL: 54 mg/dL — ABNORMAL HIGH (ref 0.0–40.0)

## 2020-12-24 LAB — COMPREHENSIVE METABOLIC PANEL
ALT: 73 U/L — ABNORMAL HIGH (ref 0–53)
AST: 87 U/L — ABNORMAL HIGH (ref 0–37)
Albumin: 4.1 g/dL (ref 3.5–5.2)
Alkaline Phosphatase: 74 U/L (ref 39–117)
BUN: 16 mg/dL (ref 6–23)
CO2: 29 mEq/L (ref 19–32)
Calcium: 10 mg/dL (ref 8.4–10.5)
Chloride: 104 mEq/L (ref 96–112)
Creatinine, Ser: 1.06 mg/dL (ref 0.40–1.50)
GFR: 72.4 mL/min (ref >60.00)
Glucose, Bld: 90 mg/dL (ref 70–99)
Potassium: 4.3 mEq/L (ref 3.5–5.1)
Sodium: 140 mEq/L (ref 135–145)
Total Bilirubin: 0.3 mg/dL (ref 0.2–1.2)
Total Protein: 6.3 g/dL (ref 6.0–8.3)

## 2020-12-24 LAB — T4, FREE: Free T4: 0.61 ng/dL (ref 0.60–1.60)

## 2020-12-24 LAB — VITAMIN B12: Vitamin B-12: 296 pg/mL (ref 211–911)

## 2020-12-24 LAB — HEMOGLOBIN A1C: Hgb A1c MFr Bld: 5.7 % (ref 4.6–6.5)

## 2020-12-24 LAB — LDL CHOLESTEROL, DIRECT: Direct LDL: 78 mg/dL

## 2020-12-24 LAB — VITAMIN D 25 HYDROXY (VIT D DEFICIENCY, FRACTURES): VITD: 30.06 ng/mL (ref 30.00–100.00)

## 2020-12-24 LAB — PSA, MEDICARE: PSA: 0.39 ng/ml (ref 0.10–4.00)

## 2020-12-24 LAB — FOLATE: Folate: 21 ng/mL (ref >5.9)

## 2020-12-24 LAB — TSH: TSH: 1.8 u[IU]/mL (ref 0.35–4.50)

## 2020-12-24 NOTE — Patient Instructions (Addendum)
Preventive Care 65 Years and Older, Male Preventive care refers to lifestyle choices and visits with your health care provider that can promote health and wellness. This includes:  A yearly physical exam. This is also called an annual wellness visit.  Regular dental and eye exams.  Immunizations.  Screening for certain conditions.  Healthy lifestyle choices, such as: ? Eating a healthy diet. ? Getting regular exercise. ? Not using drugs or products that contain nicotine and tobacco. ? Limiting alcohol use. What can I expect for my preventive care visit? Physical exam Your health care provider will check your:  Height and weight. These may be used to calculate your BMI (body mass index). BMI is a measurement that tells if you are at a healthy weight.  Heart rate and blood pressure.  Body temperature.  Skin for abnormal spots. Counseling Your health care provider may ask you questions about your:  Past medical problems.  Family's medical history.  Alcohol, tobacco, and drug use.  Emotional well-being.  Home life and relationship well-being.  Sexual activity.  Diet, exercise, and sleep habits.  History of falls.  Memory and ability to understand (cognition).  Work and work environment.  Access to firearms. What immunizations do I need? Vaccines are usually given at various ages, according to a schedule. Your health care provider will recommend vaccines for you based on your age, medical history, and lifestyle or other factors, such as travel or where you work.   What tests do I need? Blood tests  Lipid and cholesterol levels. These may be checked every 5 years, or more often depending on your overall health.  Hepatitis C test.  Hepatitis B test. Screening  Lung cancer screening. You may have this screening every year starting at age 55 if you have a 30-pack-year history of smoking and currently smoke or have quit within the past 15 years.  Colorectal  cancer screening. ? All adults should have this screening starting at age 50 and continuing until age 75. ? Your health care provider may recommend screening at age 45 if you are at increased risk. ? You will have tests every 1-10 years, depending on your results and the type of screening test.  Prostate cancer screening. Recommendations will vary depending on your family history and other risks.  Genital exam to check for testicular cancer or hernias.  Diabetes screening. ? This is done by checking your blood sugar (glucose) after you have not eaten for a while (fasting). ? You may have this done every 1-3 years.  Abdominal aortic aneurysm (AAA) screening. You may need this if you are a current or former smoker.  STD (sexually transmitted disease) testing, if you are at risk. Follow these instructions at home: Eating and drinking  Eat a diet that includes fresh fruits and vegetables, whole grains, lean protein, and low-fat dairy products. Limit your intake of foods with high amounts of sugar, saturated fats, and salt.  Take vitamin and mineral supplements as recommended by your health care provider.  Do not drink alcohol if your health care provider tells you not to drink.  If you drink alcohol: ? Limit how much you have to 0-2 drinks a day. ? Be aware of how much alcohol is in your drink. In the [place] one drink equals one 12 oz bottle of beer (355 mL), one 5 oz glass of wine (148 mL), or one 1 oz glass of hard liquor (44 mL).   Lifestyle  Take daily care of your teeth   and gums. Brush your teeth every morning and night with fluoride toothpaste. Floss one time each day.  Stay active. Exercise for at least 30 minutes 5 or more days each week.  Do not use any products that contain nicotine or tobacco, such as cigarettes, e-cigarettes, and chewing tobacco. If you need help quitting, ask your health care provider.  Do not use drugs.  If you are sexually active, practice safe sex.  Use a condom or other form of protection to prevent STIs (sexually transmitted infections).  Talk with your health care provider about taking a low-dose aspirin or statin.  Find healthy ways to cope with stress, such as: ? Meditation, yoga, or listening to music. ? Journaling. ? Talking to a trusted person. ? Spending time with friends and family. Safety  Always wear your seat belt while driving or riding in a vehicle.  Do not drive: ? If you have been drinking alcohol. Do not ride with someone who has been drinking. ? When you are tired or distracted. ? While texting.  Wear a helmet and other protective equipment during sports activities.  If you have firearms in your house, make sure you follow all gun safety procedures. What's next?  Visit your health care provider once a year for an annual wellness visit.  Ask your health care provider how often you should have your eyes and teeth checked.  Stay up to date on all vaccines. This information is not intended to replace advice given to you by your health care provider. Make sure you discuss any questions you have with your health care provider. Document Revised: 06/07/2019 Document Reviewed: 09/02/2018 Elsevier Patient Education  2021 Else[employer] Inc.  Orthopedic physical assessment (7th ed., pp. 164- 242). Elsevier.">  Paresthesia Paresthesia is an abnormal burning or prickling sensation. It is usually felt in the hands, arms, legs, or feet. However, it may occur in any part of the body. Usually, paresthesia is not painful. It may feel like:  Tingling or numbness.  Buzzing.  Itching. Paresthesia may occur without any clear cause, or it may be caused by:  Breathing too quickly (hyperventilation).  Pressure on a nerve.  An underlying medical condition.  Side effects of a medication.  Nutritional deficiencies.  Exposure to toxic chemicals. Most people experience temporary (transient) paresthesia at some time in their  lives. For some people, it may be long-lasting (chronic) because of an underlying medical condition. If you have paresthesia that lasts a long time, you need to be evaluated by your health care provider. Follow these instructions at home: Alcohol use  Do not drink alcohol if: ? Your health care provider tells you not to drink. ? You are pregnant, may be pregnant, or are planning to become pregnant.  If you drink alcohol: ? Limit how much you use to:  0-1 drink a day for women.  0-2 drinks a day for men. ? Be aware of how much alcohol is in your drink. In the [place] one drink equals one 12 oz bottle of beer (355 mL), one 5 oz glass of wine (148 mL), or one 1 oz glass of hard liquor (44 mL).   Nutrition  Eat a healthy diet. This includes: ? Eating foods that are high in fiber, such as fresh fruits and vegetables, whole grains, and beans. ? Limiting foods that are high in fat and processed sugars, such as fried or sweet foods.   General instructions  Take over-the-counter and prescription medicines only as told by your health  care provider.  Do not use any products that contain nicotine or tobacco, such as cigarettes and e-cigarettes. These can keep blood from reaching damaged nerves. If you need help quitting, ask your health care provider.  If you have diabetes, work closely with your health care provider to keep your blood sugar under control.  If you have numbness in your feet: ? Check every day for signs of injury or infection. Watch for redness, warmth, and swelling. ? Wear padded socks and comfortable shoes. These help protect your feet.  Keep all follow-up visits as told by your health care provider. This is important. Contact a health care provider if you:  Have paresthesia that gets worse or does not go away.  Have numbness after an injury.  Have a burning or prickling feeling that gets worse when you walk.  Have pain, cramps, or dizziness, or you faint.  Develop a  rash. Get help right away if you:  Feel muscle weakness.  Develop new weakness in an arm or leg.  Have trouble walking or moving.  Have problems with speech, understanding, or vision.  Feel confused.  Cannot control your bladder or bowel movements. Summary  Paresthesia is an abnormal burning or prickling sensation that is usually felt in the hands, arms, legs, or feet. It may also occur in other parts of the body.  Paresthesia may occur without any clear cause, or it may be caused by breathing too quickly (hyperventilation), pressure on a nerve, an underlying medical condition, side effects of a medication, nutritional deficiencies, or exposure to toxic chemicals.  If you have paresthesia that lasts a long time, you need to be evaluated by your health care provider. This information is not intended to replace advice given to you by your health care provider. Make sure you discuss any questions you have with your health care provider. Document Revised: 06/19/2020 Document Reviewed: 06/19/2020 Elsevier Patient Education  2021 Elsevier Inc.  Erectile Dysfunction Erectile dysfunction (ED) is the inability to get or keep an erection in order to have sexual intercourse. ED is considered a symptom of an underlying disorder and not considered a disease. Erectile dysfunction may include:  Inability to get an erection.  Lack of enough hardness of the erection to allow penetration.  Loss of the erection before sex is finished. What are the causes? This condition may be caused by:  Certain medicines, such as: ? Pain relievers. ? Antihistamines. ? Antidepressants. ? Blood pressure medicines. ? Water pills (diuretics). ? Ulcer medicines. ? Muscle relaxants. ? Drugs.  Excessive drinking.  Psychological causes, such as: ? Anxiety. ? Depression. ? Sadness. ? Exhaustion. ? Performance fear. ? Stress.  Physical causes, such as: ? Artery problems. This may include diabetes,  smoking, liver disease, or atherosclerosis. ? High blood pressure. ? Hormonal problems, such as low testosterone. ? Obesity. ? Nerve problems. This may include back or pelvic injuries, diabetes mellitus, multiple sclerosis, or Parkinson's disease. What are the signs or symptoms? Symptoms of this condition include:  Inability to get an erection.  Lack of enough hardness of the erection to allow penetration.  Loss of the erection before sex is finished.  Normal erections at some times, but with frequent unsatisfactory episodes.  Low sexual satisfaction in either partner due to erection problems.  A curved penis occurring with erection. The curve may cause pain or the penis may be too curved to allow for intercourse.  Never having nighttime erections. How is this diagnosed? This condition is often diagnosed  by:  Performing a physical exam to find other diseases or specific problems with the penis.  Asking you detailed questions about the problem.  Performing blood tests to check for diabetes mellitus or to measure hormone levels.  Performing other tests to check for underlying health conditions.  Performing an ultrasound exam to check for scarring.  Performing a test to check blood flow to the penis.  Doing a sleep study at home to measure nighttime erections. How is this treated? This condition may be treated by:  Medicine taken by mouth to help you achieve an erection (oral medicine).  Hormone replacement therapy to replace low testosterone levels.  Medicine that is injected into the penis. Your health care provider may instruct you how to give yourself these injections at home.  Vacuum pump. This is a pump with a ring on it. The pump and ring are placed on the penis and used to create pressure that helps the penis become erect.  Penile implant surgery. In this procedure, you may receive: ? An inflatable implant. This consists of cylinders, a pump, and a reservoir. The  cylinders can be inflated with a fluid that helps to create an erection, and they can be deflated after intercourse. ? A semi-rigid implant. This consists of two silicone rubber rods. The rods provide some rigidity. They are also flexible, so the penis can both curve downward in its normal position and become straight for sexual intercourse.  Blood vessel surgery, to improve blood flow to the penis. During this procedure, a blood vessel from a different part of the body is placed into the penis to allow blood to flow around (bypass) damaged or blocked blood vessels.  Lifestyle changes, such as exercising more, losing weight, and quitting smoking. Follow these instructions at home: Medicines  Take over-the-counter and prescription medicines only as told by your health care provider. Do not increase the dosage without first discussing it with your health care provider.  If you are using self-injections, perform injections as directed by your health care provider. Make sure to avoid any veins that are on the surface of the penis. After giving an injection, apply pressure to the injection site for 5 minutes.   General instructions  Exercise regularly, as directed by your health care provider. Work with your health care provider to lose weight, if needed.  Do not use any products that contain nicotine or tobacco, such as cigarettes and e-cigarettes. If you need help quitting, ask your health care provider.  Before using a vacuum pump, read the instructions that come with the pump and discuss any questions with your health care provider.  Keep all follow-up visits as told by your health care provider. This is important. Contact a health care provider if:  You feel nauseous.  You vomit. Get help right away if:  You are taking oral or injectable medicines and you have an erection that lasts longer than 4 hours. If your health care provider is unavailable, go to the nearest emergency room for  evaluation. An erection that lasts much longer than 4 hours can result in permanent damage to your penis.  You have severe pain in your groin or abdomen.  You develop redness or severe swelling of your penis.  You have redness spreading up into your groin or lower abdomen.  You are unable to urinate.  You experience chest pain or a rapid heart beat (palpitations) after taking oral medicines. Summary  Erectile dysfunction (ED) is the inability to get or  keep an erection during sexual intercourse. This problem can usually be treated successfully.  This condition is diagnosed based on a physical exam, your symptoms, and tests to determine the cause. Treatment varies depending on the cause and may include medicines, hormone therapy, surgery, or a vacuum pump.  You may need follow-up visits to make sure that you are using your medicines or devices correctly.  Get help right away if you are taking or injecting medicines and you have an erection that lasts longer than 4 hours. This information is not intended to replace advice given to you by your health care provider. Make sure you discuss any questions you have with your health care provider. Document Revised: 05/25/2020 Document Reviewed: 05/25/2020 Elsevier Patient Education  2021 [employer].  Rethinking drinking (NIH Publication No. 434-653-2938). Retrieved from https://pubs.[employer].com.cy.pdf">  Alcohol Use Disorder Alcohol use disorder is a condition in which drinking disrupts daily life. People with this condition drink too much alcohol and cannot control their drinking. Alcohol use disorder can cause serious problems with physical health. It can affect the brain, heart, and other internal organs. This disorder can raise the risk for certain cancers and cause problems with mental health, such as depression or anxiety. What are the causes? This condition is caused by drinking too much  alcohol over time. Some people with this condition drink to cope with or escape from negative life events. Others drink to relieve pain or symptoms of mental illness. What increases the risk? You are more likely to develop this condition if:  You have a family history of alcohol use disorder.  Your culture encourages drinking to the point of becoming drunk (intoxication).  You had a mood or conduct disorder in childhood.  You have been abused.  You are an adolescent and you: ? Have poor performance in school. ? Have poor supervision or guidance. ? Act on impulse and like taking risks. What are the signs or symptoms? Symptoms of this condition include:  Drinking more than you want to.  Trying several times without success to drink less.  Spending a lot of time thinking about alcohol, getting alcohol, drinking, or recovering from drinking.  Continuing to drink even when it is causing serious problems in your daily life.  Drinking when it is dangerous to drink, such as before driving a car.  Needing more and more alcohol to get the same effect you want (building up tolerance).  Having symptoms of withdrawal when you stop drinking. Withdrawal symptoms may include: ? Trouble sleeping, leading to tiredness (fatigue). ? Mood swings of depression and anxiety. ? Physical symptoms, such as a fast heart rate, rapid breathing, high blood pressure (hypertension), fever, cold sweats, or nausea. ? Seizures. ? Severe confusion. ? Feeling or seeing things that are not there (hallucinations). ? Shaking movements that you cannot control (tremors). How is this diagnosed? This condition is diagnosed with an assessment. Your health care provider may start by asking three or four questions about your drinking, or he or she may give you a simple test to take. This helps to get clear information from you. You may also have a physical exam or lab tests. You may be referred to a substance abuse  counselor. How is this treated? With education, some people with alcohol use disorder are able to reduce their drinking. Many with this disorder cannot change their drinking behavior on their own and need help from substance use specialists. These specialists are counselors who can help diagnose how severe your  disorder is and what type of treatment you need. Treatments may include:  Detoxification. Detoxification involves quitting drinking with supervision and direction of health care providers. Your health care provider may prescribe prescription medicines within the first week to help lessen withdrawal symptoms. Alcohol withdrawal can be dangerous and life-threatening. Detoxification may be provided in a home, community, or primary care setting, or in a hospital or substance use treatment facility.  Counseling. This may involve motivational interviewing (MI), family therapy, or cognitive behavioral therapy (CBT). It is provided by substance use treatment counselors or professional therapists. A counselor can address the things you can do to change your drinking behavior and how to maintain the changes. Talk therapy aims to: ? Identify your positive motivations to change. ? Identify and avoid the things that trigger your drinking. ? Help you learn how to plan your behavior change. ? Develop support systems that can help you sustain the change.  Medicines. Medicines can help treat this disorder by: ? Decreasing cravings. ? Decreasing the positive feeling you have when you drink. ? Causing an uncomfortable physical reaction when you drink (aversion therapy).  Mutual help groups such as Alcoholics Anonymous (AA). These groups are led by people who have quit drinking. The groups provide emotional support, advice, and guidance. Some people with this condition benefit from a combination of treatments provided by specialized substance use treatment centers.   Follow these instructions at  home: Medicines  Take over-the-counter and prescription medicines only as told by your health care provider.  Ask before starting any new medicines, herbs, or supplements. General instructions  Ask friends and family members to support your choice to stay sober.  Avoid situations where alcohol is served.  Create a plan to deal with tempting situations.  Attend support groups regularly.  Practice hobbies or activities you enjoy.  Do not drink and drive.  Keep all follow-up visits as told by your health care provider. This is important.   How is this prevented?  If you drink alcohol: ? Limit how much you use to:  0-1 drink a day for nonpregnant women.  0-2 drinks a day for men. ? Be aware of how much alcohol is in your drink. In the [place] one drink equals one 12 oz bottle of beer (355 mL), one 5 oz glass of wine (148 mL), or one 1 oz glass of hard liquor (44 mL).  If you have a mental health condition, seek treatment. Develop a healthy lifestyle through: ? Meditation or deep breathing. ? Exercise. ? Spending time in nature. ? Listening to music. ? Talking with a trusted friend or family member.  If you are an adolescent: ? Do not drink alcohol. Avoid gatherings where you might be tempted. ? Do not be afraid to say no if someone offers you alcohol. Speak up about why you do not want to drink. Set a positive example for others around you by not drinking. ? Build relationships with friends who do not drink. Where to find more information  Substance Abuse and Mental Health Services Administration: [employer]pl  Alcoholics Anonymous: [employer] Contact a health care provider if:  You cannot take your medicines as told.  Your symptoms get worse or you experience symptoms of withdrawal when you stop drinking.  You start drinking again (relapse) and your symptoms get worse. Get help right away if:  You have thoughts about hurting yourself or others. If you ever feel like you  may hurt yourself or others, or have thoughts about taking  your own life, get help right away. Go to your nearest emergency department or:  Call your local emergency services (911 in the [place]  Call a suicide crisis helpline, such as the National Suicide Prevention Lifeline at (217)420-1026. This is open 24 hours a day in the [place]  Text the Crisis Text Line at (803)351-5642 (in the [place] Summary  Alcohol use disorder is a condition in which drinking disrupts daily life. People with this condition drink too much alcohol and cannot control their drinking.  Treatment may include detoxification, counseling, medicines, and support groups.  Ask friends and family members to support you. Avoid situations where alcohol is served.  Get help right away if you have thoughts about hurting yourself or others. This information is not intended to replace advice given to you by your health care provider. Make sure you discuss any questions you have with your health care provider. Document Revised: 07/28/2019 Document Reviewed: 07/28/2019 Elsevier Patient Education  2021 [employer].

## 2020-12-24 NOTE — Progress Notes (Signed)
Subjective:   Sean Mejia is a 70 y.o. male who presents for Medicare Annual/Subsequent preventive examination.   Pt states he has been doing well overall.  Started going back to the gym.  Having numbness and tingling in bilateral feet x1 year.  Walking helps alleviate the sensation.  Sensations worse first thing in the morning.  Patient now wearing proper fitting shoes.  Was wearing an 8-1/2, now wearing 9 wide.  Patient mentions history of alcohol abuse most of his life.  States unable to cut down, typically quit cold [place].  Has gone 1 year without drinking.  Will have 4 drinks in the evening.  Typically starts with wine, then has rum.  Pt's friends and family have commented on his drinking.  Patient does not need an eye-opener and does not feel bad in the a.m.   Having issues with ED.  Unable to achieve or maintain an erection.  Patient had eye exam and dental exam last month.  Review of Systems    General: Denies fever, chills, night sweats, changes in weight, changes in appetite HEENT: Denies headaches, ear pain, changes in vision, rhinorrhea, sore throat CV: Denies CP, palpitations, SOB, orthopnea Pulm: Denies SOB, cough, wheezing GI: Denies abdominal pain, nausea, vomiting, diarrhea, constipation GU: Denies dysuria, hematuria, frequency +ED Msk: Denies muscle cramps, joint pains Neuro: Denies weakness  + numbness, tingling Skin: Denies rashes, bruising Psych: Denies depression, anxiety, hallucinations     Objective:    Today's Vitals   12/24/20 1008  BP: 122/82  Pulse: 78  Temp: 97.6 F (36.4 C)  TempSrc: Oral  SpO2: 97%  Weight: 185 lb (83.9 kg)  Height: 5' 8.25" (1.734 m)   Body mass index is 27.92 kg/m. Gen. Pleasant, well developed, well-nourished, in NAD HEENT - [place]/AT, PERRL, EOMI, conjunctive clear, no scleral icterus, deviated septum, no nasal drainage, pharynx without erythema or exudate. Neck: No JVD, no thyromegaly, no carotid bruits Lungs: no use of  accessory muscles, no dullness to percussion, CTAB, no wheezes, rales or rhonchi Cardiovascular: RRR, No r/g/m, no peripheral edema Abdomen: BS present, soft, nontender,nondistended, no hepatosplenomegaly Musculoskeletal: No deformities, moves all four extremities, no cyanosis or clubbing, normal tone Neuro:  A&Ox3, CN II-XII intact, normal gait Skin:  Warm, dry, intact, no lesions Psych: normal affect, mood appropriate  Current Medications (verified) Outpatient Encounter Medications as of 12/24/2020  Medication Sig  . aspirin 81 MG EC tablet Take 81 mg by mouth daily. Swallow whole.  . DULoxetine (CYMBALTA) 60 MG capsule TAKE 1 CAPSULE BY MOUTH 2 TIMES DAILY.  . Multiple Vitamins-Minerals (MULTIVITAMIN PO) Take by mouth daily.  Marland Kitchen olmesartan-hydrochlorothiazide (BENICAR HCT) 40-25 MG tablet TAKE 1 TABLET BY MOUTH EVERY DAY  . Omega-3 Fatty Acids (FISH OIL PO) Take by mouth.  Marland Kitchen omeprazole (PRILOSEC) 40 MG capsule TAKE 1 CAPSULE (40 MG TOTAL) BY MOUTH EVERY OTHER DAY.  . simvastatin (ZOCOR) 40 MG tablet TAKE 1 TABLET AT BEDTIME   No facility-administered encounter medications on file as of 12/24/2020.    Allergies (verified) Amoxicillin and Penicillins   History: Past Medical History:  Diagnosis Date  . Depression   . GERD (gastroesophageal reflux disease)    no medications taken for this  . Hyperlipidemia   . Hypertension    Past Surgical History:  Procedure Laterality Date  . EYE SURGERY     both eyes   Family History  Problem Relation Age of Onset  . COPD Mother   . Lupus Mother   .  Heart attack Father   . Cancer Brother        bladder and liver  . Colon cancer Neg Hx   . Esophageal cancer Neg Hx   . Stomach cancer Neg Hx   . Rectal cancer Neg Hx    Social History   Socioeconomic History  . Marital status: Divorced    Spouse name: Not on file  . Number of children: Not on file  . Years of education: Not on file  . Highest education level: Not on file   Occupational History  . Not on file  Tobacco Use  . Smoking status: Never Smoker  . Smokeless tobacco: Never Used  Substance and Sexual Activity  . Alcohol use: Yes    Comment: 3 times a week  . Drug use: No  . Sexual activity: Not on file  Other Topics Concern  . Not on file  Social History Narrative  . Not on file   Social Determinants of Health   Financial Resource Strain: Not on file  Food Insecurity: Not on file  Transportation Needs: Not on file  Physical Activity: Not on file  Stress: Not on file  Social Connections: Not on file    Tobacco Counseling Counseling given: Not Answered   Activities of Daily Living No flowsheet data found.  Patient Care Team: Deeann Saint, MD as PCP - General (Family Medicine) Archer Asa, MD as Attending Physician (Psychiatry)  Indicate any recent Medical Services you may have received from other than [facility] providers in the past year (date may be approximate).     Assessment:   This is a routine wellness examination for [name].  Hearing/Vision screen No exam data present  Dietary issues and exercise activities discussed:    Goals   None    Depression Screen PHQ 2/9 Scores 12/24/2020 12/22/2016  PHQ - 2 Score 0 0  PHQ- 9 Score 0 -    Fall Risk Fall Risk  12/24/2020 12/22/2019 12/22/2016  Falls in the past year? 0 0 No  Number falls in past yr: - 0 -  Injury with Fall? - 0 -  Risk for fall due to : - No Fall Risks -  Follow up - Falls evaluation completed -    FALL RISK PREVENTION PERTAINING TO THE HOME:  Any stairs in or around the home? Yes  If so, are there any without handrails? No  Home free of loose throw rugs in walkways, pet beds, electrical cords, etc? Yes  Adequate lighting in your home to reduce risk of falls? Yes   ASSISTIVE DEVICES UTILIZED TO PREVENT FALLS:  Life alert? No  Use of a cane, walker or w/c? No  Grab bars in the bathroom? No  Shower chair or bench in shower? No  Elevated toilet seat  or a handicapped toilet? No   Gait steady and fast without use of assistive device  Cognitive Function: A&O x3.  Immunizations Immunization History  Administered Date(s) Administered  . Fluad Quad(high Dose 65+) 06/26/2019  . Influenza Split 06/22/2013  . Influenza Whole 06/16/2008  . Influenza, High Dose Seasonal PF 06/26/2019  . Influenza,inj,Quad PF,6+ Mos 07/01/2014, 09/07/2015  . Influenza-Unspecified 11/05/2016  . PFIZER(Purple Top)SARS-COV-2 Vaccination 11/12/2019, 12/05/2019, 07/26/2020  . Pneumococcal Conjugate-13 10/28/2018  . Pneumococcal Polysaccharide-23 12/22/2019  . Tdap 03/31/2011  . Zoster Recombinat (Shingrix) 04/22/2019, 09/06/2019    TDAP status: Up to date  Flu Vaccine status: Due, Education has been provided regarding the importance of this vaccine. Advised may receive  this vaccine at local pharmacy or Health Dept. Aware to provide a copy of the vaccination record if obtained from local pharmacy or Health Dept. Verbalized acceptance and understanding.  Pneumococcal vaccine status: Up to date  Covid-19 vaccine status: Completed vaccines  Qualifies for Shingles Vaccine? Yes   Zostavax completed No   Shingrix Completed?: Yes  Screening Tests Health Maintenance  Topic Date Due  . TETANUS/TDAP  03/30/2021  . INFLUENZA VACCINE  04/22/2021  . COLONOSCOPY (Pts 45-39yrs Insurance coverage will need to be confirmed)  10/07/2023  . COVID-19 Vaccine  Completed  . Hepatitis C Screening  Completed  . PNA vac Low Risk Adult  Completed  . HPV VACCINES  Aged Out    Health Maintenance  There are no preventive care reminders to display for this patient.  Colorectal cancer screening: Type of screening: Colonoscopy. Completed 10/06/13. Repeat every 10 years  Lung Cancer Screening: (Low Dose CT Chest recommended if Age 11-80 years, 30 pack-year currently smoking OR have quit w/in 15years.) does not qualify.   Additional Screening:  Hepatitis C Screening: does  qualify; Completed 09/24/2017  Vision Screening: Recommended annual ophthalmology exams for early detection of glaucoma and other disorders of the eye. Is the patient up to date with their annual eye exam?  Yes   Dental Screening: Recommended annual dental exams for proper oral hygiene    Plan:    Medicare annual wellness visit, subsequent  Essential hypertension  -Controlled -Continue olmesartan-hydrochlorothiazide 40-25 mg - Plan: CMP  Mixed hyperlipidemia  -Continue Zocor 40 mg - Plan: T4, Free, Hemoglobin A1c, Lipid panel  Uncomplicated alcohol dependence (HCC) -Discussed the importance of cutting down and not stopping cold [place] -Given handouts -Continue to monitor at each visit - Plan: CMP, Vitamin B12, Folate, CBC with Differential/Platelet, TSH, Hemoglobin A1c  Erectile dysfunction, unspecified erectile dysfunction type -Discussed possible causes including EtOH use  - Plan: CBC with Differential/Platelet, TSH, T4, Free, Hemoglobin A1c, PSA, Medicare  Numbness and tingling of both feet  - Plan: CMP, Vitamin B12, Folate, Vitamin D, 25-hydroxy, CBC with Differential/Platelet, TSH, T4, Free, Hemoglobin A1c  Screening for prostate cancer - Plan: PSA, Medicare  Follow-up as needed in 1-2 months  I have personally reviewed and noted the following in the patient's chart:   . Medical and social history . Use of alcohol, tobacco or illicit drugs  . Current medications and supplements . Functional ability and status . Nutritional status . Physical activity . Advanced directives . List of other physicians . Hospitalizations, surgeries, and ER visits in previous 12 months . Vitals . Screenings to include cognitive, depression, and falls . Referrals and appointments  In addition, I have reviewed and discussed with patient certain preventive protocols, quality metrics, and best practice recommendations. A written personalized care plan for preventive services as well as  general preventive health recommendations were provided to patient.     Deeann Saint, MD   12/24/2020

## 2020-12-28 ENCOUNTER — Other Ambulatory Visit: Payer: Self-pay | Admitting: Family Medicine

## 2020-12-28 DIAGNOSIS — F1020 Alcohol dependence, uncomplicated: Secondary | ICD-10-CM

## 2020-12-28 DIAGNOSIS — R7989 Other specified abnormal findings of blood chemistry: Secondary | ICD-10-CM

## 2020-12-31 NOTE — Progress Notes (Signed)
Results viewed on MyChart. 

## 2021-01-02 ENCOUNTER — Encounter: Payer: Self-pay | Admitting: Family Medicine

## 2021-01-02 ENCOUNTER — Ambulatory Visit
Admission: RE | Admit: 2021-01-02 | Discharge: 2021-01-02 | Disposition: A | Discharge status: Home / Self Care | Payer: Medicare Other | Source: Ambulatory Visit | Attending: Family Medicine | Admitting: Family Medicine

## 2021-01-02 DIAGNOSIS — R7989 Other specified abnormal findings of blood chemistry: Secondary | ICD-10-CM

## 2021-01-02 DIAGNOSIS — F1020 Alcohol dependence, uncomplicated: Secondary | ICD-10-CM

## 2021-01-28 ENCOUNTER — Encounter: Payer: Self-pay | Admitting: Family Medicine

## 2021-01-30 ENCOUNTER — Other Ambulatory Visit: Payer: Self-pay

## 2021-01-30 ENCOUNTER — Encounter: Payer: Self-pay | Admitting: Podiatry

## 2021-01-30 ENCOUNTER — Ambulatory Visit (INDEPENDENT_AMBULATORY_CARE_PROVIDER_SITE_OTHER): Payer: Medicare Other

## 2021-01-30 ENCOUNTER — Ambulatory Visit (INDEPENDENT_AMBULATORY_CARE_PROVIDER_SITE_OTHER): Payer: Medicare Other | Admitting: Podiatry

## 2021-01-30 DIAGNOSIS — M79671 Pain in right foot: Secondary | ICD-10-CM

## 2021-01-30 DIAGNOSIS — M2042 Other hammer toe(s) (acquired), left foot: Secondary | ICD-10-CM

## 2021-01-30 DIAGNOSIS — M2041 Other hammer toe(s) (acquired), right foot: Secondary | ICD-10-CM | POA: Diagnosis not present

## 2021-01-30 DIAGNOSIS — G629 Polyneuropathy, unspecified: Secondary | ICD-10-CM

## 2021-01-30 DIAGNOSIS — M79672 Pain in left foot: Secondary | ICD-10-CM | POA: Diagnosis not present

## 2021-01-30 MED ORDER — GABAPENTIN 100 MG PO CAPS: 100.0000 mg | ORAL_CAPSULE | Freq: Three times a day (TID) | ORAL | 3 refills | Status: DC

## 2021-01-30 NOTE — Patient Instructions (Signed)
Gout  Gout is painful swelling of your joints. Gout is a type of arthritis. It is caused by having too much uric acid in your body. Uric acid is a chemical that is made when your body breaks down substances called purines. If your body has too much uric acid, sharp crystals can form and build up in your joints. This causes pain and swelling. Gout attacks can happen quickly and be very painful (acute gout). Over time, the attacks can affect more joints and happen more often (chronic gout). What are the causes?  Too much uric acid in your blood. This can happen because: ? Your kidneys do not remove enough uric acid from your blood. ? Your body makes too much uric acid. ? You eat too many foods that are high in purines. These foods include organ meats, some seafood, and beer.  Trauma or stress. What increases the risk?  Having a family history of gout.  Being male and middle-aged.  Being male and having gone through menopause.  Being very overweight (obese).  Drinking alcohol, especially beer.  Not having enough water in the body (being dehydrated).  Losing weight too quickly.  Having an organ transplant.  Having lead poisoning.  Taking certain medicines.  Having kidney disease.  Having a skin condition called psoriasis. What are the signs or symptoms? An attack of acute gout usually happens in just one joint. The most common place is the big toe. Attacks often start at night. Other joints that may be affected include joints of the feet, ankle, knee, fingers, wrist, or elbow. Symptoms of an attack may include:  Very bad pain.  Warmth.  Swelling.  Stiffness.  Shiny, red, or purple skin.  Tenderness. The affected joint may be very painful to touch.  Chills and fever. Chronic gout may cause symptoms more often. More joints may be involved. You may also have white or yellow lumps (tophi) on your hands or feet or in other areas near your joints.   How is this  treated?  Treatment for this condition has two phases: treating an acute attack and preventing future attacks.  Acute gout treatment may include: ? NSAIDs. ? Steroids. These are taken by mouth or injected into a joint. ? Colchicine. This medicine relieves pain and swelling. It can be given by mouth or through an IV tube.  Preventive treatment may include: ? Taking small doses of NSAIDs or colchicine daily. ? Using a medicine that reduces uric acid levels in your blood. ? Making changes to your diet. You may need to see a food expert (dietitian) about what to eat and drink to prevent gout. Follow these instructions at home: During a gout attack  If told, put ice on the painful area: ? Put ice in a plastic bag. ? Place a towel between your skin and the bag. ? Leave the ice on for 20 minutes, 2-3 times a day.  Raise (elevate) the painful joint above the level of your heart as often as you can.  Rest the joint as much as possible. If the joint is in your leg, you may be given crutches.  Follow instructions from your doctor about what you cannot eat or drink.   Avoiding future gout attacks  Eat a low-purine diet. Avoid foods and drinks such as: ? Liver. ? Kidney. ? Anchovies. ? Asparagus. ? Herring. ? Mushrooms. ? Mussels. ? Beer.  Stay at a healthy weight. If you want to lose weight, talk with your doctor. Do   not lose weight too fast.  Start or continue an exercise plan as told by your doctor. Eating and drinking  Drink enough fluids to keep your pee (urine) pale yellow.  If you drink alcohol: ? Limit how much you use to:  0-1 drink a day for women.  0-2 drinks a day for men. ? Be aware of how much alcohol is in your drink. In the [place] one drink equals one 12 oz bottle of beer (355 mL), one 5 oz glass of wine (148 mL), or one 1 oz glass of hard liquor (44 mL). General instructions  Take over-the-counter and prescription medicines only as told by your doctor.  Do  not drive or use heavy machinery while taking prescription pain medicine.  Return to your normal activities as told by your doctor. Ask your doctor what activities are safe for you.  Keep all follow-up visits as told by your doctor. This is important. Contact a doctor if:  You have another gout attack.  You still have symptoms of a gout attack after 10 days of treatment.  You have problems (side effects) because of your medicines.  You have chills or a fever.  You have burning pain when you pee (urinate).  You have pain in your lower back or belly. Get help right away if:  You have very bad pain.  Your pain cannot be controlled.  You cannot pee. Summary  Gout is painful swelling of the joints.  The most common site of pain is the big toe, but it can affect other joints.  Medicines and avoiding some foods can help to prevent and treat gout attacks. This information is not intended to replace advice given to you by your health care provider. Make sure you discuss any questions you have with your health care provider. Document Revised: 03/31/2018 Document Reviewed: 03/31/2018 Elsevier Patient Education  2021 [employer]  

## 2021-01-31 NOTE — Progress Notes (Signed)
Subjective:   Patient ID: Sean Mejia, male   DOB: 70 y.o.   MRN: [id number]   HPI Patient states he has a lot of tingling in his toes of both feet stating he is got digital deformities and also he does stay very active walking an hour a day and states his toes always look red and irritated.  Patient does not smoke likes to be active   Review of Systems  All other systems reviewed and are negative.       Objective:  Physical Exam Vitals and nursing note reviewed.  Constitutional:      Appearance: He is well-developed.  Pulmonary:     Effort: Pulmonary effort is normal.  Musculoskeletal:        General: Normal range of motion.  Skin:    General: Skin is warm.  Neurological:     Mental Status: He is alert.     Neurovascular status found to be intact muscle strength found to be adequate range of motion adequate.  Patient is noted to have elevation of the lesser digits bilateral with redness of the toes but no indications of vascular disease or indications of Raynaud's type syndrome.  Patient does have good digital perfusion well oriented x3 but I did note mild diminishment of sharp dull vibratory     Assessment:  Difficult to say what this is it may be some form of a systemic condition or it may be a localized condition with low-grade neuropathic condition     Plan:  H&P x-rays reviewed and I have recommended the utilization of wider shoe gear and we will get a go ahead and start him on gabapentin which will be taken at a very low-dose 100 mg nighttime and then possible adding 1 in the morning 1 in the afternoon.  If any other changes were to occur he will let us know and I do not recommend treatment for hammertoe deformities which are not symptomatic  X-rays indicate there is moderate digital deformities no indications of advanced arthritis or fracture

## 2021-02-03 ENCOUNTER — Other Ambulatory Visit: Payer: Self-pay | Admitting: Family Medicine

## 2021-02-04 ENCOUNTER — Telehealth: Payer: Self-pay | Admitting: Family Medicine

## 2021-02-04 DIAGNOSIS — H9190 Unspecified hearing loss, unspecified ear: Secondary | ICD-10-CM

## 2021-02-04 NOTE — Telephone Encounter (Signed)
Pt is calling needing a referral to a [facility] for both ears and pt is needing it to say pt had a evaluation during his annual physical and he is need a audiogram and the order can be faxed to [phone] with the doctors hand written signature instead of a stamp signature along with her NPI #.Marland Kitchen

## 2021-02-08 NOTE — Addendum Note (Signed)
Addended by: Kern Reap B on: 02/08/2021 04:50 PM   Modules accepted: Orders

## 2021-02-08 NOTE — Telephone Encounter (Signed)
Ok

## 2021-02-25 ENCOUNTER — Ambulatory Visit: Payer: Medicare Other | Admitting: Family Medicine

## 2021-03-21 ENCOUNTER — Other Ambulatory Visit: Payer: Self-pay | Admitting: Family Medicine

## 2021-04-13 IMAGING — US US ABDOMEN LIMITED RUQ/ASCITES
1 series · 14 of 25 positions shown · non-contrast
Comparison: None.

CLINICAL DATA: Elevated LFTs

EXAM:
ULTRASOUND ABDOMEN LIMITED RIGHT UPPER QUADRANT

[Series 1: us abdomen limited ruq/ascites · 0.19mm/px · 14 of 38 slices shown]
[im 1/38]
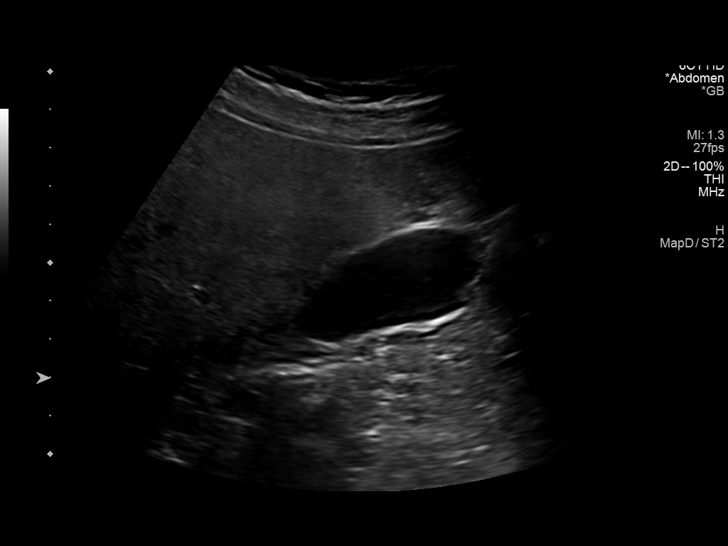
[im 4/38]
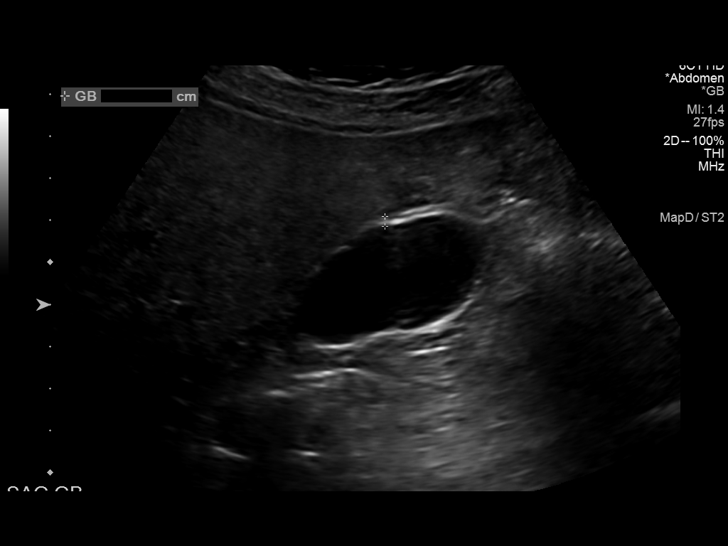
[im 7/38]
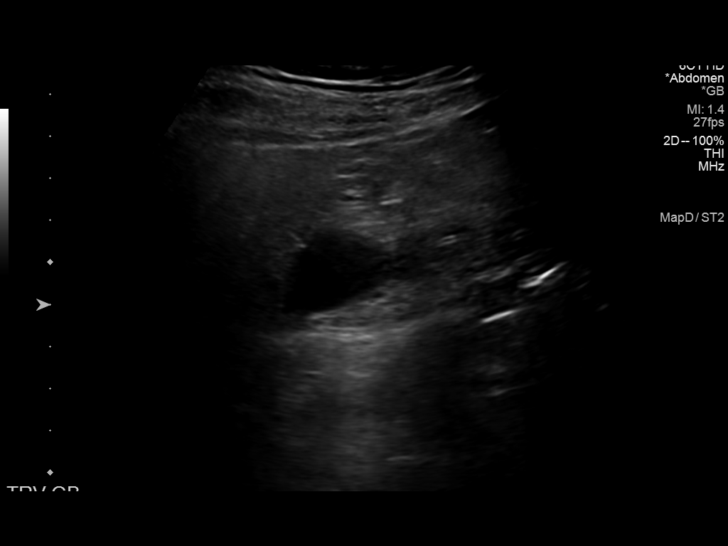
[im 10/38]
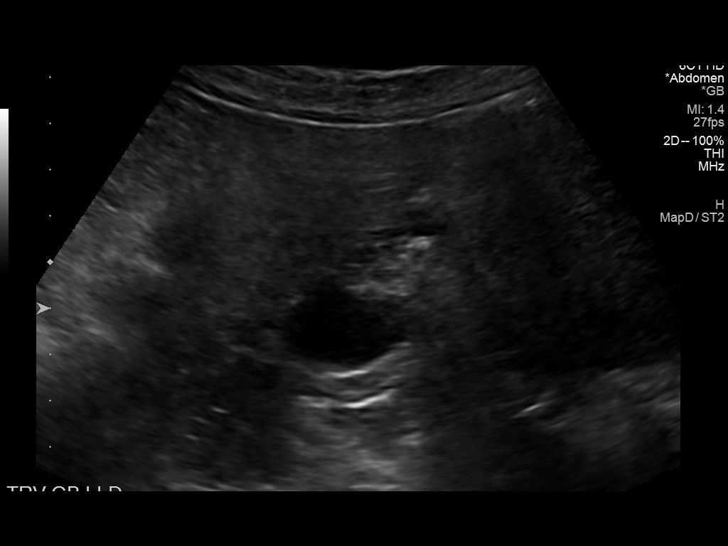
[im 13/38]
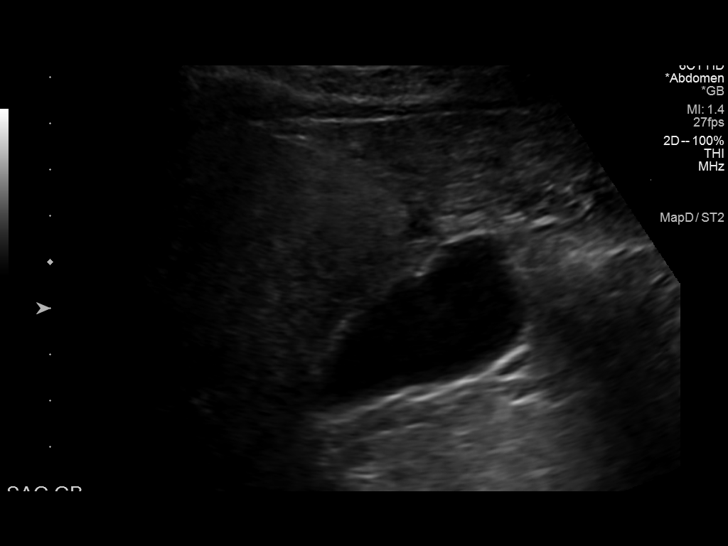
[im 14/38]
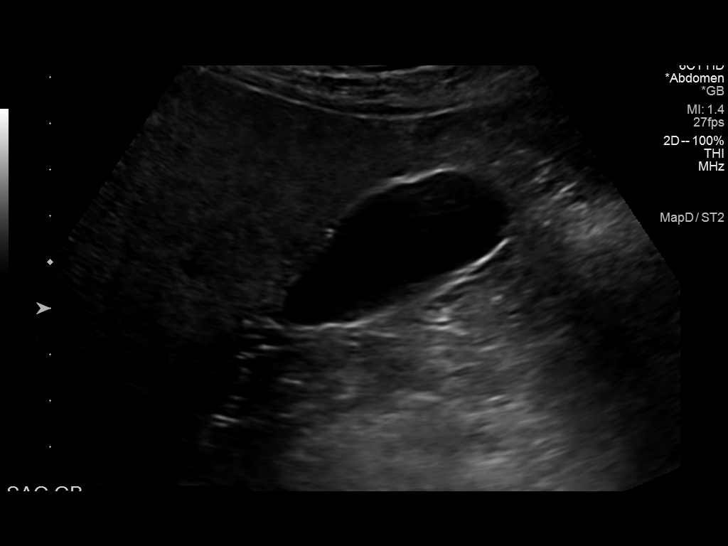
[im 17/38]
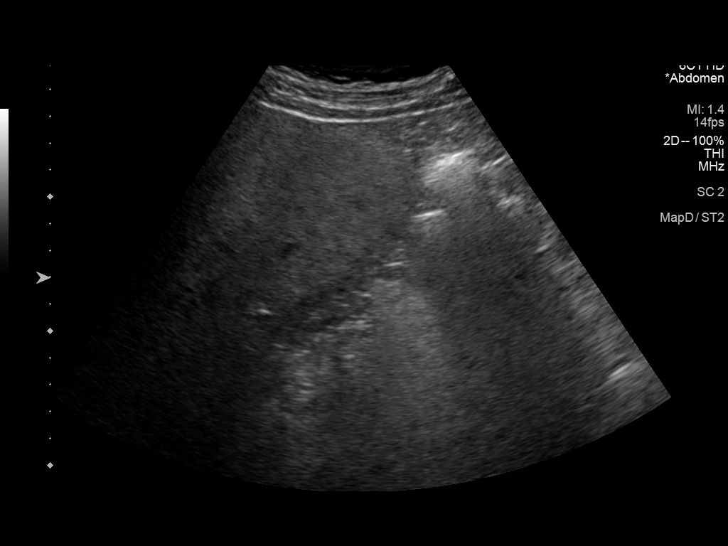
[im 21/38]
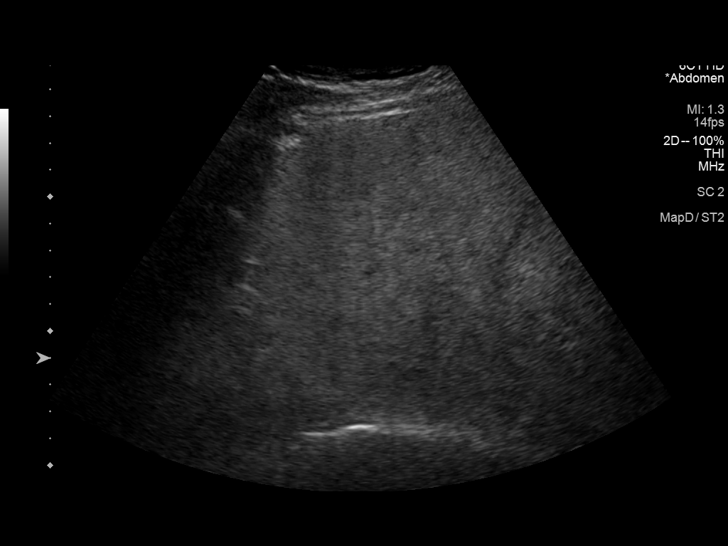
[im 24/38]
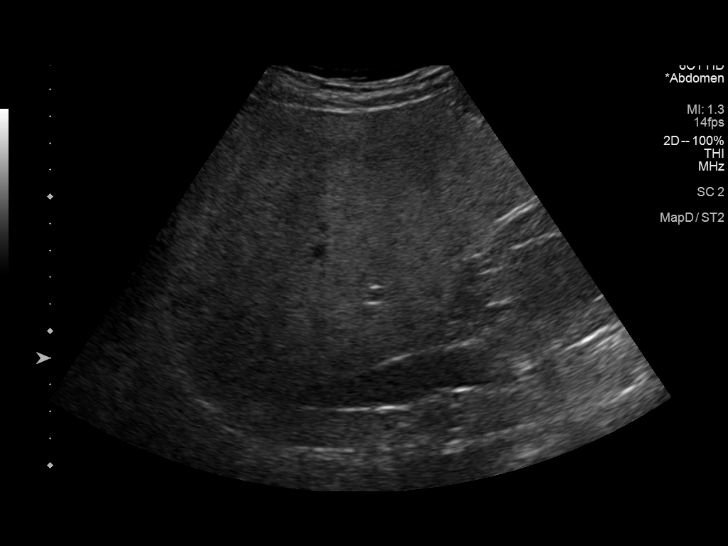
[im 25/38]
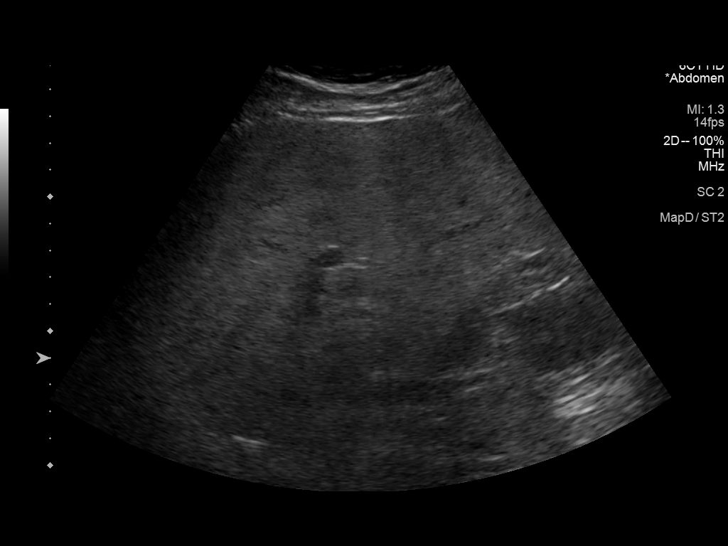
[im 28/38]
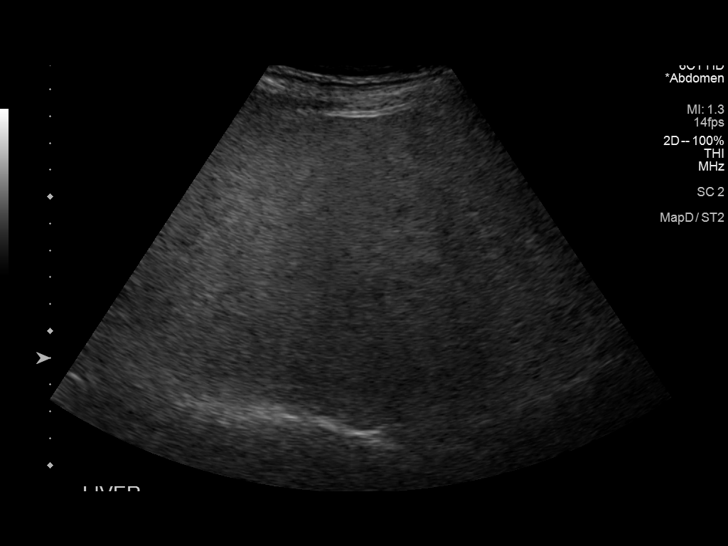
[im 31/38]
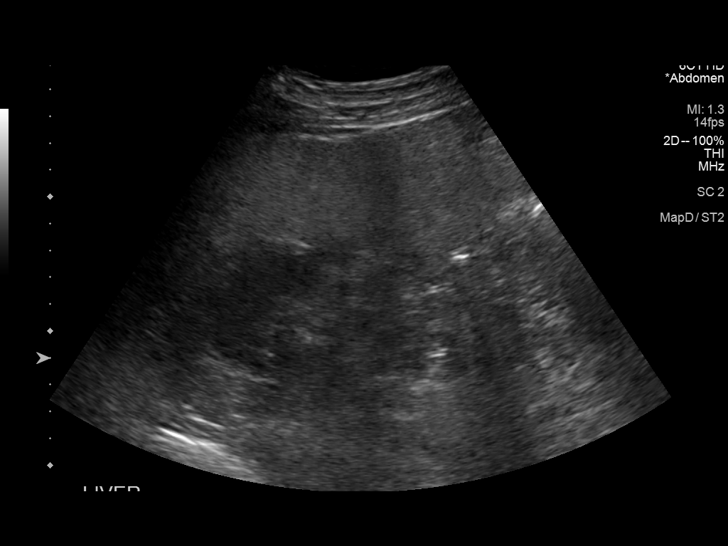
[im 34/38]
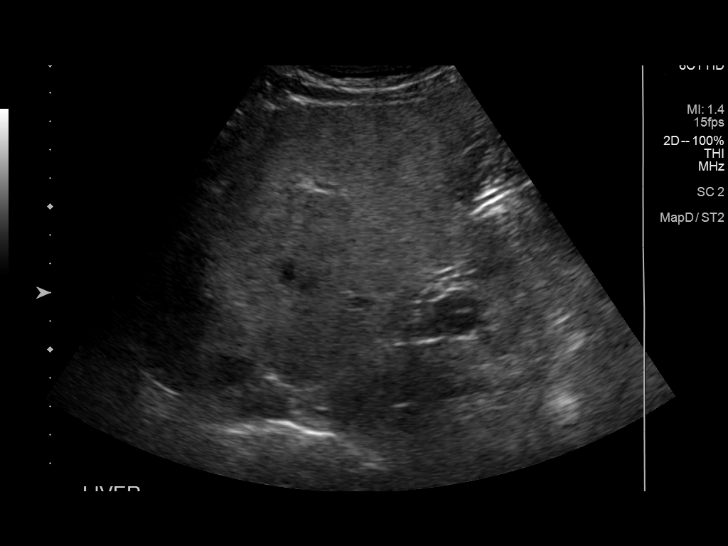
[im 38/38]
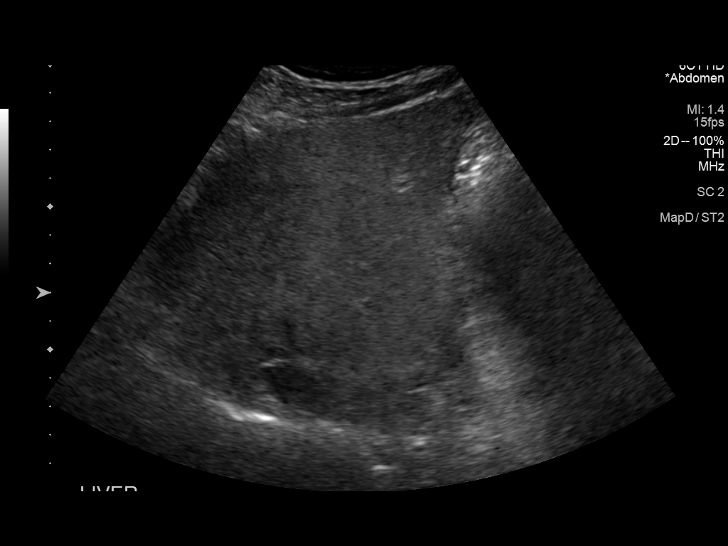

[14 of 25 positions shown; findings below may reference images not displayed]

FINDINGS: Gallbladder:

No gallstones or wall thickening visualized. No sonographic Murphy
sign noted by sonographer.

Common bile duct:

Diameter: 2.8 mm

Liver:

No focal lesion identified. Increased hepatic parenchymal
echogenicity. Portal vein is patent on color Doppler imaging with
normal direction of blood flow towards the liver.

Other: None.
IMPRESSION: 1. No cholelithiasis or sonographic evidence of acute cholecystitis.

## 2021-04-15 ENCOUNTER — Ambulatory Visit (INDEPENDENT_AMBULATORY_CARE_PROVIDER_SITE_OTHER): Payer: Medicare Other | Admitting: Podiatry

## 2021-04-15 ENCOUNTER — Other Ambulatory Visit: Payer: Self-pay

## 2021-04-15 DIAGNOSIS — M2042 Other hammer toe(s) (acquired), left foot: Secondary | ICD-10-CM | POA: Diagnosis not present

## 2021-04-15 DIAGNOSIS — G629 Polyneuropathy, unspecified: Secondary | ICD-10-CM | POA: Diagnosis not present

## 2021-04-15 DIAGNOSIS — M2041 Other hammer toe(s) (acquired), right foot: Secondary | ICD-10-CM | POA: Diagnosis not present

## 2021-04-15 MED ORDER — GABAPENTIN 400 MG PO CAPS: 400.0000 mg | ORAL_CAPSULE | Freq: Three times a day (TID) | ORAL | 5 refills | Status: DC

## 2021-04-17 NOTE — Progress Notes (Signed)
Subjective:   Patient ID: Sean Mejia, male   DOB: 70 y.o.   MRN: [id number]   HPI Patient continues to complain of tingling and burning in both feet and states that he went from 100 mg of gabapentin to 200 mg and it did not seem to make a big difference.  He states it is worse when he gets up in the morning he still has discoloration of his toes and at times his toes are red at times blue but there is no circulatory issues as far as the macro circulation   ROS      Objective:  Physical Exam  Patient does have significant digital deformities bilateral and is exhibiting neuropathic-like symptomatology bilateral whether his toe color is different or changed and he does at times have cool toes and at times warm toes with color changes F2     Assessment:  I do think this is some kind of vasospasm issue but I do not think there is any current treatment for it as he does have good macro circulatory flow into his feet with palpable DP PT pulses.     Plan:  Discussed this with him at great length.  I did discuss a neurologist consult and he wants to hold off on this and I did discuss trying to use techniques to warm his feet up at night and wearing socks.  I am increasing his gabapentin to 400 mg and we will see if this does help with the symptoms that he is experiencing and spent time educating him on gabapentin and its usage

## 2021-04-29 ENCOUNTER — Ambulatory Visit: Payer: Medicare Other | Admitting: Family Medicine

## 2021-06-28 ENCOUNTER — Other Ambulatory Visit: Payer: Self-pay | Admitting: Podiatry

## 2021-06-28 ENCOUNTER — Other Ambulatory Visit: Payer: Self-pay | Admitting: Family Medicine

## 2021-06-28 DIAGNOSIS — I10 Essential (primary) hypertension: Secondary | ICD-10-CM

## 2021-07-07 ENCOUNTER — Ambulatory Visit (HOSPITAL_COMMUNITY)
Admission: EM | Admit: 2021-07-07 | Discharge: 2021-07-07 | Disposition: A | Discharge status: Home / Self Care | Payer: Medicare Other | Attending: Emergency Medicine | Admitting: Emergency Medicine

## 2021-07-07 ENCOUNTER — Encounter (HOSPITAL_COMMUNITY): Payer: Self-pay

## 2021-07-07 DIAGNOSIS — W540XXA Bitten by dog, initial encounter: Secondary | ICD-10-CM | POA: Diagnosis not present

## 2021-07-07 DIAGNOSIS — Z23 Encounter for immunization: Secondary | ICD-10-CM | POA: Diagnosis not present

## 2021-07-07 DIAGNOSIS — S0193XA Puncture wound without foreign body of unspecified part of head, initial encounter: Secondary | ICD-10-CM | POA: Diagnosis not present

## 2021-07-07 MED ORDER — DOXYCYCLINE HYCLATE 100 MG PO CAPS: 100.0000 mg | ORAL_CAPSULE | Freq: Two times a day (BID) | ORAL | 0 refills | Status: DC

## 2021-07-07 MED ORDER — TETANUS-DIPHTH-ACELL PERTUSSIS 5-2.5-18.5 LF-MCG/0.5 IM SUSY
PREFILLED_SYRINGE | INTRAMUSCULAR | Status: AC
  Filled 2021-07-07: qty 0.5

## 2021-07-07 MED ORDER — TETANUS-DIPHTH-ACELL PERTUSSIS 5-2.5-18.5 LF-MCG/0.5 IM SUSY
0.5000 mL | PREFILLED_SYRINGE | Freq: Once | INTRAMUSCULAR | Status: AC
  Administered 2021-07-07: 0.5 mL via INTRAMUSCULAR

## 2021-07-07 NOTE — ED Provider Notes (Signed)
[facility]    CSN: [id number] Arrival date & time: 07/07/21  1600      History   Chief Complaint Chief Complaint  Patient presents with   Animal Bite    HPI Sean Mejia is a 70 y.o. male.   Patient here for evaluation of dog bite to left side of his head.  Reports he was bit by his neighbors dog earlier today.  Dog is up-to-date on rabies with last vaccination date of 12/14/2020.  Unknown when his last tetanus was.  Has not used any OTC medications or treatments.  Denies any specific alleviating or aggravating factors.  Denies any fevers, chest pain, shortness of breath, N/V/D, numbness, tingling, weakness, abdominal pain, or headaches.    The history is provided by the patient.  Animal Bite  Past Medical History:  Diagnosis Date   Depression    GERD (gastroesophageal reflux disease)    no medications taken for this   Hyperlipidemia    Hypertension     Patient Active Problem List   Diagnosis Date Noted   GERD (gastroesophageal reflux disease) 05/31/2014   Other testicular hypofunction 12/18/2011   Dyslipidemia 05/16/2009   DECREASED LIBIDO 05/16/2009   TEMPOROMANDIBULAR JOINT DISORDER 01/15/2009   THROMBOCYTOSIS 06/16/2008   DEPRESSION 05/26/2007   Essential hypertension 05/26/2007    Past Surgical History:  Procedure Laterality Date   EYE SURGERY     both eyes       Home Medications    Prior to Admission medications   Medication Sig Start Date End Date Taking? Authorizing Provider  doxycycline (VIBRAMYCIN) 100 MG capsule Take 1 capsule (100 mg total) by mouth 2 (two) times daily. 07/07/21  Yes Ivette Loyal, NP  aspirin 81 MG EC tablet Take 81 mg by mouth daily. Swallow whole.    [provider]  DULoxetine (CYMBALTA) 60 MG capsule TAKE 1 CAPSULE BY MOUTH 2 TIMES DAILY. 12/21/20   Deeann Saint, MD  gabapentin (NEURONTIN) 100 MG capsule TAKE 1 CAPSULE (100 MG TOTAL) BY MOUTH THREE TIMES DAILY. 06/28/21   Lenn Sink, DPM   Multiple Vitamins-Minerals (MULTIVITAMIN PO) Take by mouth daily.    [provider]  olmesartan-hydrochlorothiazide (BENICAR HCT) 40-25 MG tablet TAKE 1 TABLET BY MOUTH EVERY DAY 06/28/21   Deeann Saint, MD  Omega-3 Fatty Acids (FISH OIL PO) Take by mouth.    [provider]  omeprazole (PRILOSEC) 40 MG capsule TAKE 1 CAPSULE (40 MG TOTAL) BY MOUTH EVERY OTHER DAY. 03/22/21   Deeann Saint, MD  simvastatin (ZOCOR) 40 MG tablet TAKE 1 TABLET BY MOUTH EVERYDAY AT BEDTIME 06/28/21   Deeann Saint, MD    Family History Family History  Problem Relation Age of Onset   COPD Mother    Lupus Mother    Heart attack Father    Cancer Brother        bladder and liver   Colon cancer Neg Hx    Esophageal cancer Neg Hx    Stomach cancer Neg Hx    Rectal cancer Neg Hx     Social History Social History   Tobacco Use   Smoking status: Never   Smokeless tobacco: Never  Substance Use Topics   Alcohol use: Yes    Comment: 3 times a week   Drug use: No     Allergies   Amoxicillin and Penicillins   Review of Systems Review of Systems  Skin:  Positive for wound.  All other  systems reviewed and are negative.   Physical Exam Triage Vital Signs ED Triage Vitals  Enc Vitals Group     BP 07/07/21 1640 133/75     Pulse Rate 07/07/21 1640 100     Resp 07/07/21 1640 18     Temp 07/07/21 1640 99.4 F (37.4 C)     Temp Source 07/07/21 1640 Oral     SpO2 07/07/21 1640 96 %     Weight --      Height --      Head Circumference --      Peak Flow --      Pain Score 07/07/21 1644 3     Pain Loc --      Pain Edu? --      Excl. in GC? --    No data found.  Updated Vital Signs BP 133/75 (BP Location: Left Arm)   Pulse 100   Temp 99.4 F (37.4 C) (Oral)   Resp 18   SpO2 96%   Visual Acuity Right Eye Distance:   Left Eye Distance:   Bilateral Distance:    Right Eye Near:   Left Eye Near:    Bilateral Near:     Physical Exam Vitals and nursing note  reviewed.  Constitutional:      General: He is not in acute distress.    Appearance: Normal appearance. He is not ill-appearing, toxic-appearing or diaphoretic.  HENT:     Head: Normocephalic and atraumatic.  Eyes:     Conjunctiva/sclera: Conjunctivae normal.  Cardiovascular:     Rate and Rhythm: Normal rate.     Pulses: Normal pulses.  Pulmonary:     Effort: Pulmonary effort is normal.  Abdominal:     General: Abdomen is flat.  Musculoskeletal:        General: Normal range of motion.     Cervical back: Normal range of motion.  Skin:    General: Skin is warm and dry.     Findings: Wound (approximately 1cm wound to top of left head) present.  Neurological:     General: No focal deficit present.     Mental Status: He is alert and oriented to person, place, and time.  Psychiatric:        Mood and Affect: Mood normal.      UC Treatments / Results  Labs (all labs ordered are listed, but only abnormal results are displayed) Labs Reviewed - No data to display  EKG   Radiology No results found.  Procedures Procedures (including critical care time)  Medications Ordered in UC Medications  Tdap (BOOSTRIX) injection 0.5 mL (has no administration in time range)    Initial Impression / Assessment and Plan / UC Course  I have reviewed the triage vital signs and the nursing notes.  Pertinent labs & imaging results that were available during my care of the patient were reviewed by me and considered in my medical decision making (see chart for details).    Assessment negative for red flags or concerns.  Dog bite to left head.  Wound cleaned in office and 2 Steri-Strips applied.  Tetanus updated in office.  As wound was deep we will go ahead and treat prophylactically with doxycycline twice daily for the next 10 days.  Tylenol and/or ibuprofen as needed.  Patient given wound care instructions as well as strict return precautions for any signs of infection.  Follow-up as  needed Final Clinical Impressions(s) / UC Diagnoses   Final diagnoses:  Dog bite,  initial encounter     Discharge Instructions      Keep your head dry for the next 12- 24 hours. After 24 hours you can wash your head with soap and water twice a day.  Do not rub the wound.  Pat it dry.   Take the doxycycline 1 pill twice a day for the next 10 days.  You can take Tylenol and/or ibuprofen as needed for pain relief and fever reduction.  Return for reevaluation for any worsening symptoms including worsening redness, swelling, red streaks, or fevers.     ED Prescriptions     Medication Sig Dispense Auth. Provider   doxycycline (VIBRAMYCIN) 100 MG capsule Take 1 capsule (100 mg total) by mouth 2 (two) times daily. 20 capsule Ivette Loyal, NP      PDMP not reviewed this encounter.   Ivette Loyal, NP 07/07/21 [phone]

## 2021-07-07 NOTE — Discharge Instructions (Signed)
Keep your head dry for the next 12- 24 hours. After 24 hours you can wash your head with soap and water twice a day.  Do not rub the wound.  Pat it dry.   Take the doxycycline 1 pill twice a day for the next 10 days.  You can take Tylenol and/or ibuprofen as needed for pain relief and fever reduction.  Return for reevaluation for any worsening symptoms including worsening redness, swelling, red streaks, or fevers.

## 2021-07-07 NOTE — ED Triage Notes (Signed)
Pt presents with small dog bite to left side of head from neighbors dog; dog is up to date on rabies 12/14/2020

## 2021-08-03 ENCOUNTER — Other Ambulatory Visit: Payer: Self-pay | Admitting: Family Medicine

## 2021-10-29 ENCOUNTER — Other Ambulatory Visit: Payer: Self-pay | Admitting: Family Medicine

## 2021-10-30 ENCOUNTER — Other Ambulatory Visit: Payer: Self-pay | Admitting: Family Medicine

## 2021-11-21 ENCOUNTER — Other Ambulatory Visit: Payer: Self-pay | Admitting: Podiatry

## 2021-12-12 ENCOUNTER — Ambulatory Visit: Payer: Medicare Other | Admitting: Podiatrist

## 2021-12-26 ENCOUNTER — Other Ambulatory Visit: Payer: Self-pay | Admitting: Family Medicine

## 2021-12-26 ENCOUNTER — Ambulatory Visit (INDEPENDENT_AMBULATORY_CARE_PROVIDER_SITE_OTHER): Payer: Medicare Other | Admitting: Family Medicine

## 2021-12-26 VITALS — BP 141/87 | HR 80 | Temp 97.6°F | Ht 68.0 in | Wt 183.2 lb

## 2021-12-26 DIAGNOSIS — I10 Essential (primary) hypertension: Secondary | ICD-10-CM

## 2021-12-26 DIAGNOSIS — Z0000 Encounter for general adult medical examination without abnormal findings: Secondary | ICD-10-CM

## 2021-12-26 DIAGNOSIS — Z125 Encounter for screening for malignant neoplasm of prostate: Secondary | ICD-10-CM | POA: Diagnosis not present

## 2021-12-26 DIAGNOSIS — G629 Polyneuropathy, unspecified: Secondary | ICD-10-CM | POA: Diagnosis not present

## 2021-12-26 DIAGNOSIS — F339 Major depressive disorder, recurrent, unspecified: Secondary | ICD-10-CM

## 2021-12-26 DIAGNOSIS — E782 Mixed hyperlipidemia: Secondary | ICD-10-CM | POA: Diagnosis not present

## 2021-12-26 LAB — LDL CHOLESTEROL, DIRECT: Direct LDL: 78 mg/dL

## 2021-12-26 LAB — CBC WITH DIFFERENTIAL/PLATELET
Basophils Absolute: 0.1 10*3/uL (ref 0.0–0.1)
Basophils Relative: 2.8 % (ref 0.0–3.0)
Eosinophils Absolute: 0.2 10*3/uL (ref 0.0–0.7)
Eosinophils Relative: 3.6 % (ref 0.0–5.0)
HCT: 43 % (ref 39.0–52.0)
Hemoglobin: 15 g/dL (ref 13.0–17.0)
Lymphocytes Relative: 25.8 % (ref 12.0–46.0)
Lymphs Abs: 1.1 10*3/uL (ref 0.7–4.0)
MCHC: 34.9 g/dL (ref 30.0–36.0)
MCV: 93.2 fl (ref 78.0–100.0)
Monocytes Absolute: 0.6 10*3/uL (ref 0.1–1.0)
Monocytes Relative: 13.5 % — ABNORMAL HIGH (ref 3.0–12.0)
Neutro Abs: 2.3 10*3/uL (ref 1.4–7.7)
Neutrophils Relative %: 54.3 % (ref 43.0–77.0)
Platelets: 315 10*3/uL (ref 150.0–400.0)
RBC: 4.61 Mil/uL (ref 4.22–5.81)
RDW: 13.2 % (ref 11.5–15.5)
WBC: 4.2 10*3/uL (ref 4.0–10.5)

## 2021-12-26 LAB — BASIC METABOLIC PANEL
BUN: 11 mg/dL (ref 6–23)
CO2: 28 mEq/L (ref 19–32)
Calcium: 10.5 mg/dL (ref 8.4–10.5)
Chloride: 95 mEq/L — ABNORMAL LOW (ref 96–112)
Creatinine, Ser: 1.08 mg/dL (ref 0.40–1.50)
GFR: 70.29 mL/min (ref >60.00)
Glucose, Bld: 97 mg/dL (ref 70–99)
Potassium: 4.1 mEq/L (ref 3.5–5.1)
Sodium: 131 mEq/L — ABNORMAL LOW (ref 135–145)

## 2021-12-26 LAB — LIPID PANEL
Cholesterol: 153 mg/dL (ref 0–200)
HDL: 53.3 mg/dL (ref >39.00)
NonHDL: 99.76
Total CHOL/HDL Ratio: 3
Triglycerides: 252 mg/dL — ABNORMAL HIGH (ref 0.0–149.0)
VLDL: 50.4 mg/dL — ABNORMAL HIGH (ref 0.0–40.0)

## 2021-12-26 LAB — FOLATE: Folate: 9.9 ng/mL (ref >5.9)

## 2021-12-26 LAB — VITAMIN B12: Vitamin B-12: 329 pg/mL (ref 211–911)

## 2021-12-26 LAB — TSH: TSH: 1.81 u[IU]/mL (ref 0.35–5.50)

## 2021-12-26 LAB — T4, FREE: Free T4: 0.78 ng/dL (ref 0.60–1.60)

## 2021-12-26 MED ORDER — DULOXETINE HCL 40 MG PO CPEP: 40.0000 mg | ORAL_CAPSULE | Freq: Every day | ORAL | 3 refills | Status: DC

## 2021-12-26 NOTE — Progress Notes (Signed)
? ? ?Annual Wellness Visit ? ?  ? ? ?Patient: Sean Mejia, Male    DOB: [birth year], 71 y.o.   MRN: [id number] ?Visit Date: 12/26/2021 ? ?Today's Provider: [name], MD  ?Subjective:  ?  ?Chief Complaint  ?Patient presents with  ? Annual Exam  ?  fasting  ? ?Sean Mejia is a 71 y.o. male who presents today for his Annual Wellness Visit. ? ?HPI ?Pt states he has been doing well.  Seen by podiatry for h/o neuropathy in b/l feet.  Currently on Gabapentin 400 mg TID.  Not sure if med is helping.  Does not make drowsy.  Considering seeing Neurology.  In the past Vit B12 did not make a difference in neuropathy.  Pt checking bp at home, states it has been controlled on Olmesartan-HCTZ for bp.    Taking Cymbalta 60 mg daily x yrs.  Pt states his mood is in a good place, but he was advised by a previous provider that he could just stay on the med.  Denies myalgias or arthralgias on Zocor. ?  ?Medications: ?Outpatient Medications Prior to Visit  ?Medication Sig  ? DULoxetine (CYMBALTA) 60 MG capsule TAKE 1 CAPSULE BY MOUTH TWICE A DAY  ? gabapentin (NEURONTIN) 100 MG capsule TAKE 1 CAPSULE (100 MG TOTAL) BY MOUTH THREE TIMES DAILY.  ? olmesartan-hydrochlorothiazide (BENICAR HCT) 40-25 MG tablet TAKE 1 TABLET BY MOUTH EVERY DAY  ? omeprazole (PRILOSEC) 40 MG capsule Take 1 capsule (40 mg total) by mouth every other day.  ? simvastatin (ZOCOR) 40 MG tablet TAKE 1 TABLET BY MOUTH EVERYDAY AT BEDTIME  ? aspirin 81 MG EC tablet Take 81 mg by mouth daily. Swallow whole. (Patient not taking: Reported on 12/26/2021)  ? doxycycline (VIBRAMYCIN) 100 MG capsule Take 1 capsule (100 mg total) by mouth 2 (two) times daily. (Patient not taking: Reported on 12/26/2021)  ? Multiple Vitamins-Minerals (MULTIVITAMIN PO) Take by mouth daily. (Patient not taking: Reported on 12/26/2021)  ? Omega-3 Fatty Acids (FISH OIL PO) Take by mouth. (Patient not taking: Reported on 12/26/2021)  ? ?No facility-administered medications prior to visit.  ?   ?Allergies  ?Allergen Reactions  ? Amoxicillin   ?  REACTION: unspecified  ? Penicillins   ?  REACTION: hives  ? ? ?Patient Care Team: ?[name], MD as PCP - General (Family Medicine) ?Norma Fredrickson, MD as Attending Physician (Psychiatry) ? ?Review of Systems ?+neuropathy ? ? ?  ?Objective:  ?  ?Vitals: BP (!) 141/87 (BP Location: Left Arm, Patient Position: Sitting, Cuff Size: Normal)   Pulse 80   Temp 97.6 ?F (36.4 ?C) (Oral)   Ht 5\' 8"  (1.727 m)   Wt 183 lb 3.2 oz (83.1 kg)   SpO2 99%   BMI 27.86 kg/m?  ? ?Physical Exam ?Gen. Pleasant, well developed, well-nourished, in NAD ?HEENT - [place]/AT, PERRL, EOMI, conjunctive clear, no scleral icterus, no nasal drainage, pharynx without erythema or exudate.  TMs normal b/l. ?Neck: No JVD, no thyromegaly, no carotid bruits ?Lungs: no use of accessory muscles, CTAB, no wheezes, rales or rhonchi ?Cardiovascular: RRR, No r/g/m, no peripheral edema ?Abdomen: BS present, soft, nontender,nondistended, no hepatosplenomegaly ?Musculoskeletal: No deformities, moves all four extremities, no cyanosis or clubbing, normal tone ?Neuro:  A&Ox3, CN II-XII intact, normal gait ?Skin:  Warm, dry, intact, no lesions ?Psych: normal affect, affect appropriate ? ? ?Most recent functional status assessment: ?   ? View : No data to display.  ?  ?  ?  ? ? ?  Most recent fall risk assessment: ? ?  12/26/2021  ? 10:57 AM  ?Fall Risk   ?Falls in the past year? 0  ?Number falls in past yr: 0  ?Injury with Fall? 0  ?Risk for fall due to : No Fall Risks  ?Follow up Falls evaluation completed  ? ?  ?Most recent depression screenings: ? ?  12/26/2021  ? 10:57 AM 12/24/2020  ? 10:11 AM  ?PHQ 2/9 Scores  ?PHQ - 2 Score 0 0  ?PHQ- 9 Score 1 0  ? ? ?Most recent cognitive screening: ?   ? View : No data to display.  ?  ?  ?  ? ?   ?Assessment & Plan:  ?   ? ?Annual wellness visit done today including the all of the following: ?Reviewed patient's Family Medical History ?Reviewed and updated list of  patient's medical providers ?Assessment of cognitive impairment was done ?Assessed patient's functional ability ?Established a written schedule for health screening services ?Health Risk Assessent Completed and Reviewed ? ?Exercise Activities and Dietary recommendations ? Goals   ?None ?  ? ? ?Immunization History  ?Administered Date(s) Administered  ? Fluad Quad(high Dose 65+) 06/26/2019  ? Influenza Split 06/22/2013  ? Influenza Whole 06/16/2008  ? Influenza, High Dose Seasonal PF 06/26/2019  ? Influenza,inj,Quad PF,6+ Mos 07/01/2014, 09/07/2015  ? Influenza-Unspecified 11/05/2016  ? PFIZER(Purple Top)SARS-COV-2 Vaccination 11/12/2019, 12/05/2019, 07/26/2020  ? Pneumococcal Conjugate-13 10/28/2018  ? Pneumococcal Polysaccharide-23 12/22/2019  ? Tdap 03/31/2011, 07/07/2021  ? Zoster Recombinat (Shingrix) 04/22/2019, 09/06/2019  ? ? ?Health Maintenance  ?Topic Date Due  ? COVID-19 Vaccine (4 - Booster for Pfizer series) 09/20/2020  ? INFLUENZA VACCINE  04/22/2022  ? COLONOSCOPY (Pts 45-39yrs Insurance coverage will need to be confirmed)  10/07/2023  ? TETANUS/TDAP  07/08/2031  ? Pneumonia Vaccine 80+ Years old  Completed  ? Hepatitis C Screening  Completed  ? Zoster Vaccines- Shingrix  Completed  ? HPV VACCINES  Aged Out  ? ? ?Discussed health benefits of physical activity, and encouraged him to engage in regular exercise appropriate for his age and condition.  ?  ?Medicare annual wellness visit, subsequent ? ?Essential hypertension  ?-controlled ?-continue olmesartan-hydrochlorothiazide 40-25 mg daily. ?-Continue lifestyle modifications ?- Plan: Basic metabolic panel, TSH, T4, Free ? ?Mixed hyperlipidemia  ?-Continue Zocor 40 mg daily ?-Continue lifestyle modification ?- Plan: Lipid panel ? ?Screening for prostate cancer ?- Plan: PSA, Medicare ? ?Neuropathy  ?-continue Gabapentin 400 mg TID ?-continue f/u with Podiatry  ?-considering Neurology eval.   ?-Currently on Duloxetine 60 mg daily for h/o depression, but  discussed decreasing dose to 40 mg.  As med can also help with neuropathy can adjust dose if notes increased neuropathy on the lower dose. ?- Plan: CBC with Differential/Platelet, Vitamin B12, Folate ? ?Depression, recurrent ([place])  ?-stable ?-discussed decreasing dose of Duloxetine from 60 mg to 40 mg. ?- Plan: DULoxetine 40 MG CPEP ? ? [name], MD  ? ?

## 2021-12-30 ENCOUNTER — Ambulatory Visit (INDEPENDENT_AMBULATORY_CARE_PROVIDER_SITE_OTHER): Payer: Medicare Other | Admitting: Podiatry

## 2021-12-30 DIAGNOSIS — G629 Polyneuropathy, unspecified: Secondary | ICD-10-CM

## 2021-12-30 DIAGNOSIS — M2042 Other hammer toe(s) (acquired), left foot: Secondary | ICD-10-CM

## 2021-12-30 DIAGNOSIS — I7300 Raynaud's syndrome without gangrene: Secondary | ICD-10-CM

## 2021-12-30 DIAGNOSIS — M2041 Other hammer toe(s) (acquired), right foot: Secondary | ICD-10-CM

## 2021-12-30 MED ORDER — GABAPENTIN 400 MG PO CAPS: 400.0000 mg | ORAL_CAPSULE | Freq: Three times a day (TID) | ORAL | 5 refills | Status: DC

## 2021-12-31 NOTE — Progress Notes (Signed)
Subjective:  ? ?Patient ID: Sean Mejia, male   DOB: 71 y.o.   MRN: [id number]  ? ?HPI ?Patient presents stating that he has trouble with the nerve endings in both his feet most likely has Raynaud's phenomena and does have digital deformities of both feet.  States that the nerve irritation is worse after  ? ? ?ROS ? ? ?   ?Objective:  ?Physical Exam  ?Neurovascular status was found to be moderately diminished sharp dull vibratory with patient having reasonable circulatory status but does have distal redness of his toes which seems to indicate that he probably has some form of Raynaud's or vascular issue ? ?   ?Assessment:  ?Extensive neuropathic-like symptomatology along with probable Raynaud's digital deformities ? ?   ?Plan:  ?H&P reviewed conditions and recommended gabapentin 300 mg to try educated him on this.  Do not see anything else currently to do but I do want him to keep his feet warm and try to avoid cold or other exposure ?   ? ? ?

## 2022-01-03 ENCOUNTER — Telehealth: Payer: Self-pay | Admitting: Family Medicine

## 2022-01-03 NOTE — Telephone Encounter (Signed)
Pt is calling and has viewed his chole results and  he thinks he may need higher dose of chole med. Please advise  ?[id number] - [place], [place] - [address] Phone:  [phone]  ?Fax:  [phone]  ?  ? ?

## 2022-01-05 ENCOUNTER — Encounter: Payer: Self-pay | Admitting: Family Medicine

## 2022-01-06 ENCOUNTER — Other Ambulatory Visit: Payer: Self-pay | Admitting: Family Medicine

## 2022-01-06 DIAGNOSIS — E781 Pure hyperglyceridemia: Secondary | ICD-10-CM

## 2022-01-06 MED ORDER — SIMVASTATIN 80 MG PO TABS: 80.0000 mg | ORAL_TABLET | Freq: Every day | ORAL | 3 refills | Status: DC

## 2022-01-06 NOTE — Telephone Encounter (Signed)
Spoke with pt, is aware, verbalized understanding. ?

## 2022-01-06 NOTE — Telephone Encounter (Signed)
The increased dose of Zocor 80 mg was sent to your pharmacy.  Monitor for muscle soreness/pain as the occurrence of thees symptoms can occur more frequently at the higher dose.  Another option is continuing Zocor 40 mg and adding a medication that specifically targets lowering triglycerides such as a fenofibrate.  We can recheck your cholesterol in 3 to 6 months. ?

## 2022-02-02 ENCOUNTER — Other Ambulatory Visit: Payer: Self-pay | Admitting: Family Medicine

## 2022-03-16 ENCOUNTER — Other Ambulatory Visit: Payer: Self-pay | Admitting: Family Medicine

## 2022-03-16 DIAGNOSIS — I10 Essential (primary) hypertension: Secondary | ICD-10-CM

## 2022-04-03 ENCOUNTER — Other Ambulatory Visit: Payer: Self-pay | Admitting: Family Medicine

## 2022-04-03 DIAGNOSIS — E781 Pure hyperglyceridemia: Secondary | ICD-10-CM

## 2022-04-07 ENCOUNTER — Other Ambulatory Visit: Payer: Self-pay | Admitting: Family Medicine

## 2022-04-23 ENCOUNTER — Encounter: Payer: Self-pay | Admitting: Podiatry

## 2022-04-23 ENCOUNTER — Ambulatory Visit (INDEPENDENT_AMBULATORY_CARE_PROVIDER_SITE_OTHER): Payer: Medicare Other

## 2022-04-23 ENCOUNTER — Ambulatory Visit (INDEPENDENT_AMBULATORY_CARE_PROVIDER_SITE_OTHER): Payer: Medicare Other | Admitting: Podiatry

## 2022-04-23 DIAGNOSIS — M722 Plantar fascial fibromatosis: Secondary | ICD-10-CM

## 2022-04-23 MED ORDER — TRIAMCINOLONE ACETONIDE 10 MG/ML IJ SUSP
10.0000 mg | Freq: Once | INTRAMUSCULAR | Status: AC
  Administered 2022-04-23: 10 mg

## 2022-04-23 NOTE — Progress Notes (Signed)
Subjective:   Patient ID: Sean Mejia, male   DOB: 71 y.o.   MRN: [id number]   HPI Patient presents stating he has developed intense discomfort in his mid arch area left and he is trying to be more active now and states he feels like he may have overdone it with walking.  Has had no other changes health history    ROS      Objective:  Physical Exam  Neurovascular status intact with inflammation pain of the mid arch area left with fluid buildup around this area with moderate depression of the arch and history of neuropathy     Assessment:  Mid arch fasciitis left with pain     Plan:  H&P performed x-rays reviewed and went ahead today did sterile prep and injected the mid arch left 3 mg Kenalog 5 mg Xylocaine and applied fascial brace to lift up the arch.  Gave instructions on support gave instructions on shoe gear modifications reappoint to recheck  X-rays indicate that there is moderate flatfoot deformity noted no indications arthritis or stress fracture with spur formation noted

## 2022-04-28 ENCOUNTER — Other Ambulatory Visit: Payer: Self-pay | Admitting: Family Medicine

## 2022-04-28 DIAGNOSIS — F339 Major depressive disorder, recurrent, unspecified: Secondary | ICD-10-CM

## 2022-05-14 ENCOUNTER — Encounter: Payer: Self-pay | Admitting: Podiatry

## 2022-05-14 ENCOUNTER — Ambulatory Visit (INDEPENDENT_AMBULATORY_CARE_PROVIDER_SITE_OTHER): Payer: Medicare Other | Admitting: Podiatry

## 2022-05-14 DIAGNOSIS — M722 Plantar fascial fibromatosis: Secondary | ICD-10-CM

## 2022-05-15 NOTE — Progress Notes (Signed)
Subjective:   Patient ID: Sean Mejia, male   DOB: 71 y.o.   MRN: [id number]   HPI Patient states he is significantly improved with only minimal discomfort if he has been active for a long period of time with his heel   ROS      Objective:  Physical Exam  Neurovascular status intact significant improvement of the left plantar fascia at insertion tendon into the calcaneus     Assessment:  Acute fasciitis-like symptomatology left significantly improved     Plan:  Reviewed the continuation of physical therapy supportive shoes and not going barefoot.  Discussed brace usage that I like him to continue and if symptoms were to persist or get worse I want to see him back

## 2022-06-05 ENCOUNTER — Other Ambulatory Visit: Payer: Self-pay | Admitting: Podiatry

## 2022-06-30 ENCOUNTER — Other Ambulatory Visit: Payer: Self-pay | Admitting: Podiatry

## 2022-06-30 ENCOUNTER — Telehealth: Payer: Self-pay | Admitting: *Deleted

## 2022-06-30 MED ORDER — GABAPENTIN 400 MG PO CAPS: 400.0000 mg | ORAL_CAPSULE | Freq: Three times a day (TID) | ORAL | 5 refills | Status: DC

## 2022-06-30 NOTE — Telephone Encounter (Signed)
Patient notified

## 2022-06-30 NOTE — Telephone Encounter (Signed)
Patient is requesting a medication refill on Gabapentin,has upcoming appointment next week, would like something to hold him over until visit.  Please advise.

## 2022-06-30 NOTE — Telephone Encounter (Signed)
I sent in

## 2022-07-09 ENCOUNTER — Ambulatory Visit (INDEPENDENT_AMBULATORY_CARE_PROVIDER_SITE_OTHER): Payer: Medicare Other | Admitting: Podiatry

## 2022-07-09 DIAGNOSIS — M722 Plantar fascial fibromatosis: Secondary | ICD-10-CM | POA: Diagnosis not present

## 2022-07-09 DIAGNOSIS — G629 Polyneuropathy, unspecified: Secondary | ICD-10-CM | POA: Diagnosis not present

## 2022-07-09 DIAGNOSIS — I7300 Raynaud's syndrome without gangrene: Secondary | ICD-10-CM | POA: Diagnosis not present

## 2022-07-09 MED ORDER — GABAPENTIN 400 MG PO CAPS: 400.0000 mg | ORAL_CAPSULE | Freq: Three times a day (TID) | ORAL | 5 refills | Status: DC

## 2022-07-10 ENCOUNTER — Telehealth: Payer: Self-pay | Admitting: *Deleted

## 2022-07-10 NOTE — Patient Outreach (Signed)
  Care Coordination   07/10/2022 Name: Sean Mejia MRN: [id number] DOB: 12-Sep-1951   Care Coordination Outreach Attempts:  An unsuccessful telephone outreach was attempted today to offer the patient information about available care coordination services as a benefit of their health plan.   Follow Up Plan:  Additional outreach attempts will be made to offer the patient care coordination information and services.   Encounter Outcome:  No Answer  Care Coordination Interventions Activated:  No   Care Coordination Interventions:  No, not indicated    Raina Mina, RN Care Management Coordinator [place] Office [phone]

## 2022-07-10 NOTE — Progress Notes (Signed)
Subjective:   Patient ID: Sean Mejia, male   DOB: 71 y.o.   MRN: [id number]   HPI Patient presents stating that his arches are improving they still can hurt at times but better than previous and patient states that he still has his neuropathic pain that the gabapentin helps him to a point   ROS      Objective:  Physical Exam  Neurovascular status found to be intact diminishment sharp dull vibratory with patient whose neuropathic pain seems to be worse at nighttime with high arch foot structure and chronic pain in the fascia which has been improved recently     Assessment:  Chronic fasciitis with neuropathic pain bilateral that we are keeping under control     Plan:  H&P spent a great deal time educating and reviewing these different treatment options and at this point organ to defer from any injection treatment continue with good supportive insoles and we discussed gabapentin I wrote him another prescription today and I advised on days that he is having intense activity and a lot more pain at night he can double up at night on his medication

## 2022-07-14 ENCOUNTER — Ambulatory Visit (INDEPENDENT_AMBULATORY_CARE_PROVIDER_SITE_OTHER): Payer: Medicare Other | Admitting: Family Medicine

## 2022-07-14 VITALS — BP 118/88 | HR 94 | Temp 97.4°F | Ht 68.0 in | Wt 185.4 lb

## 2022-07-14 DIAGNOSIS — J209 Acute bronchitis, unspecified: Secondary | ICD-10-CM | POA: Diagnosis not present

## 2022-07-14 MED ORDER — HYDROCOD POLI-CHLORPHE POLI ER 10-8 MG/5ML PO SUER: 5.0000 mL | Freq: Two times a day (BID) | ORAL | 0 refills | Status: DC | PRN

## 2022-07-14 NOTE — Progress Notes (Signed)
Established Patient Office Visit  Subjective   Patient ID: Sean Mejia, male    DOB: [birth year]  Age: 71 y.o. MRN: [id number]  Chief Complaint  Patient presents with   Cough    Patient complains of cough, x1 week,Productive cough with greenish sputum    Ear Pain    HPI   1 week history of cough.  Started off dry and initially had some mild sore throat and nasal congestion.  Now cough productive of green sputum.  Cough is severe at night and interfering tremendously with sleep.  No relief with over-the-counter medication.  COVID testing negative.  No fever.  No dyspnea.  Non-smoker.  Some ear pain.  Denies any acute or chronic sinusitis symptoms.  Past Medical History:  Diagnosis Date   Depression    GERD (gastroesophageal reflux disease)    no medications taken for this   Hyperlipidemia    Hypertension    Past Surgical History:  Procedure Laterality Date   EYE SURGERY     both eyes    reports that he has never smoked. He has never used smokeless tobacco. He reports current alcohol use. He reports that he does not use drugs. family history includes COPD in his mother; Cancer in his brother; Heart attack in his father; Lupus in his mother. Allergies  Allergen Reactions   Amoxicillin     REACTION: unspecified   Penicillins     REACTION: hives    Review of Systems  Constitutional:  Negative for chills and fever.  HENT:  Positive for ear pain. Negative for hearing loss.   Respiratory:  Positive for cough and sputum production. Negative for hemoptysis, shortness of breath and wheezing.   Cardiovascular:  Negative for chest pain.      Objective:     BP 118/88 (BP Location: Left Arm, Patient Position: Sitting, Cuff Size: Normal)   Pulse 94   Temp (!) 97.4 F (36.3 C) (Oral)   Ht 5\' 8"  (1.727 m)   Wt 185 lb 6.4 oz (84.1 kg)   SpO2 97%   BMI 28.19 kg/m    Physical Exam Vitals reviewed.  Constitutional:      Appearance: Normal appearance.  HENT:     Right  Ear: Tympanic membrane normal.     Left Ear: Tympanic membrane normal.     Mouth/Throat:     Mouth: Mucous membranes are moist.     Pharynx: Oropharynx is clear. No oropharyngeal exudate or posterior oropharyngeal erythema.  Cardiovascular:     Rate and Rhythm: Normal rate and regular rhythm.  Pulmonary:     Effort: Pulmonary effort is normal.     Breath sounds: Normal breath sounds. No wheezing or rales.  Musculoskeletal:     Cervical back: Neck supple.  Lymphadenopathy:     Cervical: No cervical adenopathy.  Neurological:     Mental Status: He is alert.      No results found for any visits on 07/14/22.    The 10-year ASCVD risk score (Arnett DK, et al., 2019) is: 14.4%    Assessment & Plan:   Cough-suspect acute viral bronchitis.  Nonfocal exam.  Afebrile.  Home COVID test negative.  -We wrote for limited Tussionex 1 teaspoon nightly for severe cough.  He is also warned not to mix alcohol with this or any other pain medication or sleep medication -Follow-up immediately for any fever or increased shortness of breath -Follow up with primary if cough not improving over the next couple weeks  Carolann Littler, MD

## 2022-07-16 ENCOUNTER — Encounter: Payer: Self-pay | Admitting: *Deleted

## 2022-07-16 ENCOUNTER — Telehealth: Payer: Self-pay | Admitting: *Deleted

## 2022-07-16 NOTE — Patient Outreach (Signed)
  Care Coordination   Initial Visit Note   07/16/2022 Name: Sean Mejia MRN: [id number] DOB: 1951/08/17  Sean Mejia is a 71 y.o. year old male who sees [name], MD for primary care. I spoke with  [name] by phone today.  What matters to the patients health and wellness today?  No needs    Goals Addressed               This Visit's Progress     COMPLETED: no needs (pt-stated)        Care Coordination Interventions: Reviewed medications with patient and discussed adherence with all medications with no needed refills Reviewed scheduled/upcoming provider appointments including pending appointments Screening for signs and symptoms of depression related to chronic disease state  Assessed social determinant of health barriers          SDOH assessments and interventions completed:  Yes  SDOH Interventions Today    Flowsheet Row Most Recent Value  SDOH Interventions   Food Insecurity Interventions Intervention Not Indicated  Housing Interventions Intervention Not Indicated  Transportation Interventions Intervention Not Indicated  Utilities Interventions Intervention Not Indicated        Care Coordination Interventions Activated:  Yes  Care Coordination Interventions:  Yes, provided   Follow up plan: No further intervention required.   Encounter Outcome:  Pt. Visit Completed   Raina Mina, RN Care Management Coordinator [place] Office [phone]

## 2022-07-16 NOTE — Patient Instructions (Signed)
Visit Information  Thank you for taking time to visit with me today. Please don't hesitate to contact me if I can be of assistance to you.   Following are the goals we discussed today:   Goals Addressed               This Visit's Progress     COMPLETED: no needs (pt-stated)        Care Coordination Interventions: Reviewed medications with patient and discussed adherence with all medications with no needed refills Reviewed scheduled/upcoming provider appointments including pending appointments Screening for signs and symptoms of depression related to chronic disease state  Assessed social determinant of health barriers          Please call the care guide team at [phone] if you need to cancel or reschedule your appointment.   If you are experiencing a Mental Health or [place] or need someone to talk to, please call the Suicide and Crisis Lifeline: 988  Patient verbalizes understanding of instructions and care plan provided today and agrees to view in [place]. Active MyChart status and patient understanding of how to access instructions and care plan via MyChart confirmed with patient.     No further follow up required: No needs    Raina Mina, RN Care Management Coordinator [place] Office [phone]

## 2022-08-27 ENCOUNTER — Other Ambulatory Visit: Payer: Self-pay | Admitting: Family Medicine

## 2022-08-27 DIAGNOSIS — I10 Essential (primary) hypertension: Secondary | ICD-10-CM

## 2022-09-27 ENCOUNTER — Other Ambulatory Visit: Payer: Self-pay | Admitting: Podiatry

## 2022-12-29 ENCOUNTER — Ambulatory Visit (INDEPENDENT_AMBULATORY_CARE_PROVIDER_SITE_OTHER): Payer: Medicare Other | Admitting: Family Medicine

## 2022-12-29 ENCOUNTER — Encounter: Payer: Self-pay | Admitting: Family Medicine

## 2022-12-29 VITALS — BP 120/80 | HR 82 | Temp 98.9°F | Ht 69.0 in | Wt 189.0 lb

## 2022-12-29 DIAGNOSIS — I10 Essential (primary) hypertension: Secondary | ICD-10-CM

## 2022-12-29 DIAGNOSIS — G629 Polyneuropathy, unspecified: Secondary | ICD-10-CM | POA: Insufficient documentation

## 2022-12-29 DIAGNOSIS — Z0000 Encounter for general adult medical examination without abnormal findings: Secondary | ICD-10-CM | POA: Diagnosis not present

## 2022-12-29 DIAGNOSIS — Z125 Encounter for screening for malignant neoplasm of prostate: Secondary | ICD-10-CM

## 2022-12-29 DIAGNOSIS — E782 Mixed hyperlipidemia: Secondary | ICD-10-CM | POA: Diagnosis not present

## 2022-12-29 DIAGNOSIS — Z8659 Personal history of other mental and behavioral disorders: Secondary | ICD-10-CM

## 2022-12-29 LAB — CBC WITH DIFFERENTIAL/PLATELET
Basophils Absolute: 0.1 10*3/uL (ref 0.0–0.1)
Basophils Relative: 2.4 % (ref 0.0–3.0)
Eosinophils Absolute: 0.1 10*3/uL (ref 0.0–0.7)
Eosinophils Relative: 3.2 % (ref 0.0–5.0)
HCT: 41.9 % (ref 39.0–52.0)
Hemoglobin: 14.7 g/dL (ref 13.0–17.0)
Lymphocytes Relative: 27.8 % (ref 12.0–46.0)
Lymphs Abs: 1.2 10*3/uL (ref 0.7–4.0)
MCHC: 35 g/dL (ref 30.0–36.0)
MCV: 96.3 fl (ref 78.0–100.0)
Monocytes Absolute: 0.6 10*3/uL (ref 0.1–1.0)
Monocytes Relative: 14.4 % — ABNORMAL HIGH (ref 3.0–12.0)
Neutro Abs: 2.2 10*3/uL (ref 1.4–7.7)
Neutrophils Relative %: 52.2 % (ref 43.0–77.0)
Platelets: 330 10*3/uL (ref 150.0–400.0)
RBC: 4.36 Mil/uL (ref 4.22–5.81)
RDW: 13.3 % (ref 11.5–15.5)
WBC: 4.2 10*3/uL (ref 4.0–10.5)

## 2022-12-29 LAB — COMPREHENSIVE METABOLIC PANEL
ALT: 44 U/L (ref 0–53)
AST: 57 U/L — ABNORMAL HIGH (ref 0–37)
Albumin: 4.4 g/dL (ref 3.5–5.2)
Alkaline Phosphatase: 80 U/L (ref 39–117)
BUN: 16 mg/dL (ref 6–23)
CO2: 28 mEq/L (ref 19–32)
Calcium: 9.9 mg/dL (ref 8.4–10.5)
Chloride: 97 mEq/L (ref 96–112)
Creatinine, Ser: 1.11 mg/dL (ref 0.40–1.50)
GFR: 67.54 mL/min (ref >60.00)
Glucose, Bld: 79 mg/dL (ref 70–99)
Potassium: 4.3 mEq/L (ref 3.5–5.1)
Sodium: 135 mEq/L (ref 135–145)
Total Bilirubin: 0.7 mg/dL (ref 0.2–1.2)
Total Protein: 6.7 g/dL (ref 6.0–8.3)

## 2022-12-29 LAB — TSH: TSH: 2.12 u[IU]/mL (ref 0.35–5.50)

## 2022-12-29 LAB — LIPID PANEL
Cholesterol: 136 mg/dL (ref 0–200)
HDL: 63.7 mg/dL (ref >39.00)
LDL Cholesterol: 35 mg/dL (ref 0–99)
NonHDL: 72.12
Total CHOL/HDL Ratio: 2
Triglycerides: 187 mg/dL — ABNORMAL HIGH (ref 0.0–149.0)
VLDL: 37.4 mg/dL (ref 0.0–40.0)

## 2022-12-29 LAB — PSA, MEDICARE: PSA: 0.31 ng/ml (ref 0.10–4.00)

## 2022-12-29 LAB — VITAMIN B12: Vitamin B-12: 175 pg/mL — ABNORMAL LOW (ref 211–911)

## 2022-12-29 NOTE — Patient Instructions (Addendum)
Increasing the dose of gabapentin, trying lyrica, or using over the counter lidocaine creams/patches may help with symptom control of the neuropathy.  Consider seeing neurologist for additional input on neuropathy if you have not done so in the past.

## 2022-12-29 NOTE — Progress Notes (Signed)
Annual Wellness Visit     Patient: Sean Mejia, Male    DOB: 1951-05-05, 72 y.o.   MRN: [id number]  Subjective  Chief Complaint  Patient presents with   Annual Exam    Sean Mejia is a 72 y.o. male who presents today for his Annual Wellness Visit. He reports consuming a general diet.  He generally feels well. He reports sleeping fairly well. He does have additional problems to discuss today.   Neuropathy in b/l feet may be a little worse.  On Gabapentin 400 mg TID with podiatry.  May trip/stumble when walking dog.  Had several falls in the last yr in his condo due to the neuropathy.  Doing balance exercises to help.  On Duloxetine for h/o depression, but no recent issues.  Was weaned from 60 mg to 40 mg visit.   HPI  PSA: Agrees to PSA testing   Patient Active Problem List   Diagnosis Date Noted   Neuropathy 12/29/2022   GERD (gastroesophageal reflux disease) 05/31/2014   Other testicular hypofunction 12/18/2011   Dyslipidemia 05/16/2009   DECREASED LIBIDO 05/16/2009   TEMPOROMANDIBULAR JOINT DISORDER 01/15/2009   THROMBOCYTOSIS 06/16/2008   Depression, recurrent (HCC) 05/26/2007   Essential hypertension 05/26/2007   Past Surgical History:  Procedure Laterality Date   EYE SURGERY     both eyes   Family History  Problem Relation Age of Onset   COPD Mother    Lupus Mother    Heart attack Father    Cancer Brother        bladder and liver   Colon cancer Neg Hx    Esophageal cancer Neg Hx    Stomach cancer Neg Hx    Rectal cancer Neg Hx    Allergies  Allergen Reactions   Amoxicillin     REACTION: unspecified   Penicillins     REACTION: hives      Medications: Outpatient Medications Prior to Visit  Medication Sig   DULoxetine (CYMBALTA) 20 MG capsule TAKE 2 CAPSULES BY MOUTH EVERY DAY   gabapentin (NEURONTIN) 400 MG capsule Take 1 capsule (400 mg total) by mouth 3 (three) times daily.   gabapentin (NEURONTIN) 400 MG capsule Take 1 capsule (400 mg  total) by mouth 3 (three) times daily.   olmesartan-hydrochlorothiazide (BENICAR HCT) 40-25 MG tablet TAKE 1 TABLET BY MOUTH EVERY DAY   omeprazole (PRILOSEC) 40 MG capsule TAKE 1 CAPSULE (40 MG TOTAL) BY MOUTH EVERY OTHER DAY   simvastatin (ZOCOR) 80 MG tablet TAKE 1 TABLET BY MOUTH EVERY DAY   [DISCONTINUED] chlorpheniramine-HYDROcodone (TUSSIONEX) 10-8 MG/5ML Take 5 mLs by mouth every 12 (twelve) hours as needed for cough.   No facility-administered medications prior to visit.    Allergies  Allergen Reactions   Amoxicillin     REACTION: unspecified   Penicillins     REACTION: hives    Patient Care Team: Deeann Saint, MD as PCP - General (Family Medicine) Archer Asa, MD as Attending Physician (Psychiatry)  ROS      Objective  BP 120/80 (BP Location: Left Arm, Patient Position: Sitting, Cuff Size: Large)   Pulse 82   Temp 98.9 F (37.2 C) (Oral)   Ht 5\' 9"  (1.753 m)   Wt 189 lb (85.7 kg)   SpO2 99%   BMI 27.91 kg/m  BP Readings from Last 3 Encounters:  12/29/22 120/80  07/14/22 118/88  12/26/21 (!) 141/87   Wt Readings from Last 3 Encounters:  12/29/22 189 lb (85.7  kg)  07/14/22 185 lb 6.4 oz (84.1 kg)  12/26/21 183 lb 3.2 oz (83.1 kg)      Physical Exam Constitutional:      Appearance: Normal appearance.  HENT:     Head: Normocephalic and atraumatic.     Right Ear: Tympanic membrane, ear canal and external ear normal.     Left Ear: Tympanic membrane, ear canal and external ear normal.     Nose: Nose normal.     Mouth/Throat:     Mouth: Mucous membranes are moist.     Pharynx: No oropharyngeal exudate or posterior oropharyngeal erythema.  Eyes:     General: No scleral icterus.    Extraocular Movements: Extraocular movements intact.     Conjunctiva/sclera: Conjunctivae normal.     Pupils: Pupils are equal, round, and reactive to light.  Neck:     Thyroid: No thyromegaly.  Cardiovascular:     Rate and Rhythm: Normal rate and regular rhythm.      Pulses: Normal pulses.     Heart sounds: Normal heart sounds. No murmur heard.    No friction rub.  Pulmonary:     Effort: Pulmonary effort is normal.     Breath sounds: Normal breath sounds. No wheezing, rhonchi or rales.  Abdominal:     General: Bowel sounds are normal.     Palpations: Abdomen is soft.     Tenderness: There is no abdominal tenderness.  Musculoskeletal:        General: No deformity. Normal range of motion.  Lymphadenopathy:     Cervical: No cervical adenopathy.  Skin:    General: Skin is warm and dry.     Findings: No lesion.  Neurological:     General: No focal deficit present.     Mental Status: He is alert and oriented to person, place, and time.  Psychiatric:        Mood and Affect: Mood normal.        Thought Content: Thought content normal.       Most recent functional status assessment:     No data to display         Most recent fall risk assessment:    12/29/2022   10:44 AM  Fall Risk   Falls in the past year? 1  Number falls in past yr: 1  Injury with Fall? 1  Follow up Falls evaluation completed    Most recent depression screenings:    12/29/2022   10:44 AM 07/16/2022   11:31 AM  PHQ 2/9 Scores  PHQ - 2 Score 0 0  PHQ- 9 Score 1    Most recent cognitive screening:     No data to display         Most recent Audit-C alcohol use screening    07/14/2022   12:14 PM  Alcohol Use Disorder Test (AUDIT)  1. How often do you have a drink containing alcohol? 3  2. How many drinks containing alcohol do you have on a typical day when you are drinking? 1  3. How often do you have six or more drinks on one occasion? 0  AUDIT-C Score 4  4. How often during the last year have you found that you were not able to stop drinking once you had started? 2  5. How often during the last year have you failed to do what was normally expected from you because of drinking? 0  6. How often during the last year have you needed a first  drink in the  morning to get yourself going after a heavy drinking session? 0  7. How often during the last year have you had a feeling of guilt of remorse after drinking? 1  8. How often during the last year have you been unable to remember what happened the night before because you had been drinking? 0  9. Have you or someone else been injured as a result of your drinking? 0  10. Has a relative or friend or a doctor or another health worker been concerned about your drinking or suggested you cut down? 0  Alcohol Use Disorder Identification Test Final Score (AUDIT) 7   A score of 3 or more in women, and 4 or more in men indicates increased risk for alcohol abuse, EXCEPT if all of the points are from question 1   Vision/Hearing Screen: No results found.    No results found for any visits on 12/29/22.    Assessment & Plan   Annual wellness visit done today including the all of the following: Reviewed patient's Family Medical History Reviewed and updated list of patient's medical providers Assessment of cognitive impairment was done Assessed patient's functional ability Established a written schedule for health screening services Health Risk Assessent Completed and Reviewed  Exercise Activities and Dietary recommendations  Goals   None     Immunization History  Administered Date(s) Administered   Fluad Quad(high Dose 65+) 06/26/2019, 06/08/2022   Influenza Split 06/22/2013   Influenza Whole 06/16/2008   Influenza, High Dose Seasonal PF 06/26/2019   Influenza,inj,Quad PF,6+ Mos 07/01/2014, 09/07/2015   Influenza-Unspecified 11/05/2016   PFIZER(Purple Top)SARS-COV-2 Vaccination 11/12/2019, 12/05/2019, 07/26/2020   Pneumococcal Conjugate-13 10/28/2018   Pneumococcal Polysaccharide-23 12/22/2019   Respiratory Syncytial Virus Vaccine,Recomb Aduvanted(Arexvy) 08/22/2022   Tdap 03/31/2011, 07/07/2021   Zoster Recombinat (Shingrix) 04/22/2019, 09/06/2019    Health Maintenance  Topic Date  Due   COVID-19 Vaccine (4 - 2023-24 season) 05/23/2022   INFLUENZA VACCINE  04/23/2023   COLONOSCOPY (Pts 45-38yrs Insurance coverage will need to be confirmed)  10/07/2023   Medicare Annual Wellness (AWV)  12/29/2023   DTaP/Tdap/Td (3 - Td or Tdap) 07/08/2031   Pneumonia Vaccine 80+ Years old  Completed   Hepatitis C Screening  Completed   Zoster Vaccines- Shingrix  Completed   HPV VACCINES  Aged Out     Discussed health benefits of physical activity, and encouraged him to engage in regular exercise appropriate for his age and condition.    Problem List Items Addressed This Visit       Cardiovascular and Mediastinum   Essential hypertension   Relevant Orders   Comprehensive metabolic panel   TSH     Nervous and Auditory   Neuropathy   Relevant Orders   CBC with Differential/Platelet   Comprehensive metabolic panel   Vitamin B12   Other Visit Diagnoses     Medicare annual wellness visit, subsequent    -  Primary   Mixed hyperlipidemia       Relevant Orders   CBC with Differential/Platelet   Comprehensive metabolic panel   Lipid panel   Screening for prostate cancer       Relevant Orders   PSA, Medicare   History of depression          Obtain labs to evaluate chronic conditions.  BP well-controlled.  Continue olmesartan-hydrochlorothiazide 40-25 mg daily.  Discussed several medication options for neuropathy.  Consider EMG/NCS if not previously done.  Also consider follow-up with neurology if not  done.  Can try OTC lidocaine patches/cream for symptom management.  Consider further weaning of Cymbalta from 40 mg to 20 mg daily as no recent depression symptoms.  Cancer screenings up-to-date.  Continue Zocor 80 mg daily.  Return if symptoms worsen or fail to improve.     Deeann Saint, MD

## 2023-01-06 ENCOUNTER — Other Ambulatory Visit: Payer: Self-pay | Admitting: Family Medicine

## 2023-01-06 DIAGNOSIS — F339 Major depressive disorder, recurrent, unspecified: Secondary | ICD-10-CM

## 2023-01-12 ENCOUNTER — Ambulatory Visit: Payer: Medicare Other

## 2023-01-12 ENCOUNTER — Ambulatory Visit (INDEPENDENT_AMBULATORY_CARE_PROVIDER_SITE_OTHER): Payer: Medicare Other

## 2023-01-12 DIAGNOSIS — E538 Deficiency of other specified B group vitamins: Secondary | ICD-10-CM

## 2023-01-12 MED ORDER — CYANOCOBALAMIN 1000 MCG/ML IJ SOLN
1000.0000 ug | Freq: Once | INTRAMUSCULAR | Status: AC
  Administered 2023-01-12: 1000 ug via INTRAMUSCULAR

## 2023-01-12 NOTE — Progress Notes (Signed)
Per orders of Dr. Banks, injection of Cyanocobalamin 1000 mcg given by Kirstie Larsen L Tyrel Lex. °Patient tolerated injection well.  °

## 2023-01-16 ENCOUNTER — Other Ambulatory Visit: Payer: Self-pay | Admitting: Family Medicine

## 2023-01-16 DIAGNOSIS — E781 Pure hyperglyceridemia: Secondary | ICD-10-CM

## 2023-01-16 DIAGNOSIS — I10 Essential (primary) hypertension: Secondary | ICD-10-CM

## 2023-01-23 ENCOUNTER — Telehealth: Payer: Self-pay | Admitting: Family Medicine

## 2023-01-23 NOTE — Telephone Encounter (Signed)
Pt called, returning CMA's call. CMA was with a patient. Pt asked that CMA call back at his earliest convenience. 

## 2023-01-23 NOTE — Telephone Encounter (Signed)
Please see result note 

## 2023-02-02 ENCOUNTER — Encounter: Payer: Self-pay | Admitting: Podiatry

## 2023-02-02 ENCOUNTER — Ambulatory Visit (INDEPENDENT_AMBULATORY_CARE_PROVIDER_SITE_OTHER): Payer: Medicare Other | Admitting: Podiatry

## 2023-02-02 DIAGNOSIS — G629 Polyneuropathy, unspecified: Secondary | ICD-10-CM

## 2023-02-02 DIAGNOSIS — M722 Plantar fascial fibromatosis: Secondary | ICD-10-CM

## 2023-02-02 DIAGNOSIS — R2681 Unsteadiness on feet: Secondary | ICD-10-CM

## 2023-02-02 DIAGNOSIS — I7300 Raynaud's syndrome without gangrene: Secondary | ICD-10-CM

## 2023-02-02 NOTE — Progress Notes (Signed)
Subjective:   Patient ID: Sean Mejia, male   DOB: 72 y.o.   MRN: [id number]   HPI Patient states that he has had increase in stinging and burning over the last 3 months and not sure whether he should stay on the medication   ROS      Objective:  Physical Exam  Neurovascular status unchanged with patient having some form of neuropathy also is getting some gait instability processes which are occurring with 2 falls in his house     Assessment:  Patient has idiopathic neuropathy that we have been using medicine to control the burning associated with it which has been helpful but is still developing problems with the neuropathic condition     Plan:  Reviewed with him again I would like for him to stay on the medicine as I do think it is beneficial and I did discuss with him the possibility for balance braces in the future.  He will be seen back to recheck and we can discuss that further and at this point I did go over exercises for him to do and other ways to try to continue to work on balance

## 2023-02-17 ENCOUNTER — Ambulatory Visit (INDEPENDENT_AMBULATORY_CARE_PROVIDER_SITE_OTHER): Payer: Medicare Other

## 2023-02-17 DIAGNOSIS — E538 Deficiency of other specified B group vitamins: Secondary | ICD-10-CM | POA: Diagnosis not present

## 2023-02-17 MED ORDER — CYANOCOBALAMIN 1000 MCG/ML IJ SOLN
1000.0000 ug | Freq: Once | INTRAMUSCULAR | Status: AC
  Administered 2023-02-17: 1000 ug via INTRAMUSCULAR

## 2023-02-17 NOTE — Progress Notes (Signed)
Pt here for monthly B12 injection per Dr Salomon Fick  B12 given IM and pt tolerated injection well.  Next B12 injection pt will call to scheduled.

## 2023-03-19 ENCOUNTER — Ambulatory Visit (INDEPENDENT_AMBULATORY_CARE_PROVIDER_SITE_OTHER): Payer: Medicare Other | Admitting: *Deleted

## 2023-03-19 DIAGNOSIS — E538 Deficiency of other specified B group vitamins: Secondary | ICD-10-CM | POA: Diagnosis not present

## 2023-03-19 MED ORDER — CYANOCOBALAMIN 1000 MCG/ML IJ SOLN
1000.0000 ug | Freq: Once | INTRAMUSCULAR | Status: AC
  Administered 2023-03-19: 1000 ug via INTRAMUSCULAR

## 2023-03-19 NOTE — Progress Notes (Signed)
Per orders of Dr. Banks, injection of B12 given by Rhaelyn Giron. Patient tolerated injection well.   

## 2023-06-17 ENCOUNTER — Ambulatory Visit (INDEPENDENT_AMBULATORY_CARE_PROVIDER_SITE_OTHER): Payer: Medicare Other

## 2023-06-17 ENCOUNTER — Encounter: Payer: Self-pay | Admitting: Podiatry

## 2023-06-17 ENCOUNTER — Ambulatory Visit (INDEPENDENT_AMBULATORY_CARE_PROVIDER_SITE_OTHER): Payer: Medicare Other | Admitting: Podiatry

## 2023-06-17 VITALS — BP 137/88 | HR 91

## 2023-06-17 DIAGNOSIS — M779 Enthesopathy, unspecified: Secondary | ICD-10-CM

## 2023-06-17 DIAGNOSIS — M109 Gout, unspecified: Secondary | ICD-10-CM

## 2023-06-17 NOTE — Progress Notes (Signed)
Subjective:   Patient ID: Sean Mejia, male   DOB: 72 y.o.   MRN: [id number]   HPI Patient states about 10 days earlier he had a lot of pain on the inside of his right ankle and was concerned about what caused that and whether or not there might be anything systemic going on with history of gout attack several years ago   ROS      Objective:  Physical Exam  Neuro vascular status intact inflammation of the inside of the right ankle with no current swelling with patient wearing a compression stocking which seems to help     Assessment:  Problem noting that this is a gout attack even though cannot rule out inflammatory or bone condition     Plan:  H&P x-ray reviewed and I went ahead and recommended continued ankle compression stocking continued anti-inflammatories and reappoint for [place] to recheck as needed and may require blood work or treatment for gout and I gave him information today on gout  X-rays were negative for signs of fracture or arthritis ankles or indications of flatfoot deformity

## 2023-06-17 NOTE — Patient Instructions (Signed)
Gout  Gout is painful swelling of your joints. Gout is a type of arthritis. It is caused by having too much uric acid in your body. Uric acid is a chemical that is made when your body breaks down substances called purines. If your body has too much uric acid, sharp crystals can form and build up in your joints. This causes pain and swelling. Gout attacks can happen quickly and be very painful (acute gout). Over time, the attacks can affect more joints and happen more often (chronic gout). What are the causes? Gout is caused by too much uric acid in your blood. This can happen because: Your kidneys do not remove enough uric acid from your blood. Your body makes too much uric acid. You eat too many foods that are high in purines. These foods include organ meats, some seafood, and beer. Trauma or stress can bring on an attack. What increases the risk? Having a family history of gout. Being male and middle-aged. Being male and having gone through menopause. Having an organ transplant. Taking certain medicines. Having certain conditions, such as: Being very overweight (obese). Lead poisoning. Kidney disease. A skin condition called psoriasis. Other risks include: Losing weight too quickly. Not having enough water in the body (being dehydrated). Drinking alcohol, especially beer. Drinking beverages that are sweetened with a type of sugar called fructose. What are the signs or symptoms? An attack of acute gout often starts at night and usually happens in just one joint. The most common place is the big toe. Other joints that may be affected include joints of the feet, ankle, knee, fingers, wrist, or elbow. Symptoms may include: Very bad pain. Warmth. Swelling. Stiffness. Tenderness. The affected joint may be very painful to touch. Shiny, red, or purple skin. Chills and fever. Chronic gout may cause symptoms more often. More joints may be involved. You may also have white or yellow lumps  (tophi) on your hands or feet or in other areas near your joints. How is this treated? Treatment for an acute attack may include medicines for pain and swelling, such as: NSAIDs, such as ibuprofen. Steroids taken by mouth or injected into a joint. Colchicine. This can be given by mouth or through an IV tube. Treatment to prevent future attacks may include: Taking small doses of NSAIDs or colchicine daily. Using a medicine that reduces uric acid levels in your blood, such as allopurinol. Making changes to your diet. You may need to see a food expert (dietitian) about what to eat and drink to prevent gout. Follow these instructions at home: During a gout attack  If told, put ice on the painful area. To do this: Put ice in a plastic bag. Place a towel between your skin and the bag. Leave the ice on for 20 minutes, 2-3 times a day. Take off the ice if your skin turns bright red. This is very important. If you cannot feel pain, heat, or cold, you have a greater risk of damage to the area. Raise the painful joint above the level of your heart as often as you can. Rest the joint as much as possible. If the joint is in your leg, you may be given crutches. Follow instructions from your doctor about what you cannot eat or drink. Avoiding future gout attacks Eat a low-purine diet. Avoid foods and drinks such as: Liver. Kidney. Anchovies. Asparagus. Herring. Mushrooms. Mussels. Beer. Stay at a healthy weight. If you want to lose weight, talk with your doctor. Do not  lose weight too fast. Start or continue an exercise plan as told by your doctor. Eating and drinking Avoid drinks sweetened by fructose. Drink enough fluids to keep your pee (urine) pale yellow. If you drink alcohol: Limit how much you have to: 0-1 drink a day for women who are not pregnant. 0-2 drinks a day for men. Know how much alcohol is in a drink. In the [place] one drink equals one 12 oz bottle of beer (355 mL), one 5 oz  glass of wine (148 mL), or one 1 oz glass of hard liquor (44 mL). General instructions Take over-the-counter and prescription medicines only as told by your doctor. Ask your doctor if you should avoid driving or using machines while you are taking your medicine. Return to your normal activities when your doctor says that it is safe. Keep all follow-up visits. Where to find more information [employer]: www.niams.http://www.myers.net/ Contact a doctor if: You have another gout attack. You still have symptoms of a gout attack after 10 days of treatment. You have problems (side effects) because of your medicines. You have chills or a fever. You have burning pain when you pee (urinate). You have pain in your lower back or belly. Get help right away if: You have very bad pain. Your pain cannot be controlled. You cannot pee. Summary Gout is painful swelling of the joints. The most common site of pain is the big toe, but it can affect other joints. Medicines and avoiding some foods can help to prevent and treat gout attacks. This information is not intended to replace advice given to you by your health care provider. Make sure you discuss any questions you have with your health care provider. Document Revised: 06/12/2021 Document Reviewed: 06/12/2021 Elsevier Patient Education  2024 [employer].

## 2023-07-12 ENCOUNTER — Other Ambulatory Visit: Payer: Self-pay | Admitting: Family Medicine

## 2023-07-12 ENCOUNTER — Other Ambulatory Visit: Payer: Self-pay | Admitting: Podiatry

## 2023-07-12 DIAGNOSIS — E781 Pure hyperglyceridemia: Secondary | ICD-10-CM

## 2023-08-15 ENCOUNTER — Other Ambulatory Visit: Payer: Self-pay | Admitting: Family Medicine

## 2023-08-15 DIAGNOSIS — I10 Essential (primary) hypertension: Secondary | ICD-10-CM

## 2023-10-22 ENCOUNTER — Ambulatory Visit: Payer: Medicare Other | Admitting: Family Medicine

## 2023-10-22 ENCOUNTER — Encounter: Payer: Self-pay | Admitting: Family Medicine

## 2023-10-22 VITALS — BP 100/80 | HR 83 | Temp 98.0°F | Ht 69.0 in | Wt 184.2 lb

## 2023-10-22 DIAGNOSIS — K219 Gastro-esophageal reflux disease without esophagitis: Secondary | ICD-10-CM | POA: Diagnosis not present

## 2023-10-22 NOTE — Progress Notes (Addendum)
Established Patient Office Visit   Subjective  Patient ID: Sean Mejia, male    DOB: 13-Oct-1950  Age: 73 y.o. MRN: [id number]  Chief Complaint  Patient presents with   Gastroesophageal Reflux    Patient has notice that GI issues have been getting worse in the last 4 months and the omerprazole is not working and make the patient nausea at times,     Patient is a 73 year old male seen for ongoing concern.  Patient endorses increasing GERD symptoms x the last few months.  Patient was having symptoms 2-3 times per month.  Was taking omeprazole 40 mg as needed.  Has since noticed more frequent symptoms several times per week at time causing nausea and vomiting.  Patient avoids foods known to cause problems such as spicy foods.  Has not noticed an association with tomato based products.  Endorses eating a lot of garlic.    Patient Active Problem List   Diagnosis Date Noted   Neuropathy 12/29/2022   GERD (gastroesophageal reflux disease) 05/31/2014   Other testicular hypofunction 12/18/2011   Dyslipidemia 05/16/2009   DECREASED LIBIDO 05/16/2009   Temporomandibular joint disorder 01/15/2009   Disease of blood and blood forming organ 06/16/2008   Depression, recurrent (HCC) 05/26/2007   Essential hypertension 05/26/2007   Past Medical History:  Diagnosis Date   Depression    GERD (gastroesophageal reflux disease)    no medications taken for this   Hyperlipidemia    Hypertension    Past Surgical History:  Procedure Laterality Date   EYE SURGERY     both eyes   Social History   Tobacco Use   Smoking status: Never   Smokeless tobacco: Never  Substance Use Topics   Alcohol use: Yes    Comment: 3 times a week   Drug use: No   Family History  Problem Relation Age of Onset   COPD Mother    Lupus Mother    Heart attack Father    Cancer Brother        bladder and liver   Colon cancer Neg Hx    Esophageal cancer Neg Hx    Stomach cancer Neg Hx    Rectal cancer Neg Hx     Allergies  Allergen Reactions   Amoxicillin     REACTION: unspecified   Penicillins     REACTION: hives      ROS Negative unless stated above    Objective:     BP 100/80 (BP Location: Left Arm, Patient Position: Sitting, Cuff Size: Normal)   Pulse 83   Temp 98 F (36.7 C) (Oral)   Ht 5\' 9"  (1.753 m)   Wt 184 lb 3.2 oz (83.6 kg)   SpO2 93%   BMI 27.20 kg/m  BP Readings from Last 3 Encounters:  10/22/23 100/80  06/17/23 137/88  12/29/22 120/80   Wt Readings from Last 3 Encounters:  10/22/23 184 lb 3.2 oz (83.6 kg)  12/29/22 189 lb (85.7 kg)  07/14/22 185 lb 6.4 oz (84.1 kg)      Physical Exam Constitutional:      Appearance: Normal appearance.  HENT:     Head: Normocephalic and atraumatic.     Nose: Nose normal.  Cardiovascular:     Rate and Rhythm: Normal rate.  Pulmonary:     Effort: Pulmonary effort is normal.  Musculoskeletal:        General: Normal range of motion.  Skin:    General: Skin is warm and dry.  Neurological:     Mental Status: He is alert and oriented to person, place, and time. Mental status is at baseline.     No results found for any visits on 10/22/23.    Assessment & Plan:  Gastroesophageal reflux disease, unspecified whether esophagitis present -     Ambulatory referral to Gastroenterology  Worsening symptoms over the last few months.  Possible causes include GERD, hiatal hernia, gastric/peptic ulcer, medications.  Also consider malignancy.  No red flag symptoms such as weight loss, fever, hematemesis.  Patient advised to start taking omeprazole 40 mg daily instead as needed.  Continue to avoid foods known to cause symptoms.  Keep a food diary.  Referral to GI placed for EGD given significant change in symptoms.  Patient given strict precautions.  Return if symptoms worsen or fail to improve.   Deeann Saint, MD

## 2023-11-15 ENCOUNTER — Other Ambulatory Visit: Payer: Self-pay | Admitting: Family Medicine

## 2023-11-15 DIAGNOSIS — E781 Pure hyperglyceridemia: Secondary | ICD-10-CM

## 2023-12-30 ENCOUNTER — Encounter: Payer: Self-pay | Admitting: Family Medicine

## 2023-12-30 ENCOUNTER — Ambulatory Visit (INDEPENDENT_AMBULATORY_CARE_PROVIDER_SITE_OTHER): Payer: Medicare Other | Admitting: Family Medicine

## 2023-12-30 VITALS — BP 122/80 | HR 90 | Temp 98.4°F | Ht 69.0 in | Wt 185.6 lb

## 2023-12-30 DIAGNOSIS — E782 Mixed hyperlipidemia: Secondary | ICD-10-CM

## 2023-12-30 DIAGNOSIS — E538 Deficiency of other specified B group vitamins: Secondary | ICD-10-CM | POA: Diagnosis not present

## 2023-12-30 DIAGNOSIS — E781 Pure hyperglyceridemia: Secondary | ICD-10-CM

## 2023-12-30 DIAGNOSIS — Z125 Encounter for screening for malignant neoplasm of prostate: Secondary | ICD-10-CM | POA: Diagnosis not present

## 2023-12-30 DIAGNOSIS — K219 Gastro-esophageal reflux disease without esophagitis: Secondary | ICD-10-CM

## 2023-12-30 DIAGNOSIS — Z0000 Encounter for general adult medical examination without abnormal findings: Secondary | ICD-10-CM | POA: Diagnosis not present

## 2023-12-30 DIAGNOSIS — R2689 Other abnormalities of gait and mobility: Secondary | ICD-10-CM

## 2023-12-30 DIAGNOSIS — Z8659 Personal history of other mental and behavioral disorders: Secondary | ICD-10-CM | POA: Diagnosis not present

## 2023-12-30 DIAGNOSIS — I10 Essential (primary) hypertension: Secondary | ICD-10-CM

## 2023-12-30 DIAGNOSIS — G629 Polyneuropathy, unspecified: Secondary | ICD-10-CM

## 2023-12-30 LAB — CBC WITH DIFFERENTIAL/PLATELET
Basophils Absolute: 0.1 10*3/uL (ref 0.0–0.1)
Basophils Relative: 2.7 % (ref 0.0–3.0)
Eosinophils Absolute: 0.1 10*3/uL (ref 0.0–0.7)
Eosinophils Relative: 3.6 % (ref 0.0–5.0)
HCT: 43.3 % (ref 39.0–52.0)
Hemoglobin: 14.9 g/dL (ref 13.0–17.0)
Lymphocytes Relative: 22 % (ref 12.0–46.0)
Lymphs Abs: 0.9 10*3/uL (ref 0.7–4.0)
MCHC: 34.4 g/dL (ref 30.0–36.0)
MCV: 96 fl (ref 78.0–100.0)
Monocytes Absolute: 0.5 10*3/uL (ref 0.1–1.0)
Monocytes Relative: 12.8 % — ABNORMAL HIGH (ref 3.0–12.0)
Neutro Abs: 2.4 10*3/uL (ref 1.4–7.7)
Neutrophils Relative %: 58.9 % (ref 43.0–77.0)
Platelets: 345 10*3/uL (ref 150.0–400.0)
RBC: 4.51 Mil/uL (ref 4.22–5.81)
RDW: 13.1 % (ref 11.5–15.5)
WBC: 4.2 10*3/uL (ref 4.0–10.5)

## 2023-12-30 LAB — COMPREHENSIVE METABOLIC PANEL WITH GFR
ALT: 34 U/L (ref 0–53)
AST: 56 U/L — ABNORMAL HIGH (ref 0–37)
Albumin: 4.5 g/dL (ref 3.5–5.2)
Alkaline Phosphatase: 84 U/L (ref 39–117)
BUN: 16 mg/dL (ref 6–23)
CO2: 29 meq/L (ref 19–32)
Calcium: 10.1 mg/dL (ref 8.4–10.5)
Chloride: 93 meq/L — ABNORMAL LOW (ref 96–112)
Creatinine, Ser: 1.04 mg/dL (ref 0.40–1.50)
GFR: 72.52 mL/min (ref >60.00)
Glucose, Bld: 87 mg/dL (ref 70–99)
Potassium: 4.4 meq/L (ref 3.5–5.1)
Sodium: 132 meq/L — ABNORMAL LOW (ref 135–145)
Total Bilirubin: 0.8 mg/dL (ref 0.2–1.2)
Total Protein: 6.9 g/dL (ref 6.0–8.3)

## 2023-12-30 LAB — VITAMIN B12: Vitamin B-12: 291 pg/mL (ref 211–911)

## 2023-12-30 LAB — LIPID PANEL
Cholesterol: 141 mg/dL (ref 0–200)
HDL: 69.4 mg/dL (ref >39.00)
LDL Cholesterol: 37 mg/dL (ref 0–99)
NonHDL: 71.55
Total CHOL/HDL Ratio: 2
Triglycerides: 173 mg/dL — ABNORMAL HIGH (ref 0.0–149.0)
VLDL: 34.6 mg/dL (ref 0.0–40.0)

## 2023-12-30 LAB — PSA, MEDICARE: PSA: 0.47 ng/mL (ref 0.10–4.00)

## 2023-12-30 LAB — TSH: TSH: 1.91 u[IU]/mL (ref 0.35–5.50)

## 2023-12-30 NOTE — Progress Notes (Signed)
 Annual Wellness Visit     Patient: Sean Mejia, Male    DOB: 1950-12-13, 73 y.o.   MRN: [id number]  Subjective  Chief Complaint  Patient presents with   Annual Exam    Sean Mejia is a 73 y.o. male who presents today for his Annual Wellness Visit. He reports consuming a general diet. Home exercise routine includes walking. He generally feels well. He reports sleeping well. He does have additional problems to discuss today.   Patient is a 73 year old male seen for AWV.  Patient states he has been doing well overall.  Notes improvement in GERD since taking PPI daily.  Does get a retching sensation with occasional emesis daily around 5 PM.  Patient denies heartburn, acid taste in mouth.  Seen by ophthalmologist.  Advised to follow-up in 3 months due to bleeding in left eye.  Patient denies changes in visual fields.  Also noted to have cataracts.  Was advised will likely need surgery next year.  Patient does mention bright lights at night bother him.  Patient endorses falling.  Lost the balance while rounding a corner in his condo.  Has no difficulty his dog each morning.    Vision:Within last year, Dental: No current dental problems, and PSA: Prostate cancer screening and PSA options (with potential risks and benefits of testing vs. not testing) were discussed along with recent recs/guidelines.    Patient Active Problem List   Diagnosis Date Noted   Neuropathy 12/29/2022   GERD (gastroesophageal reflux disease) 05/31/2014   Other testicular hypofunction 12/18/2011   Dyslipidemia 05/16/2009   DECREASED LIBIDO 05/16/2009   Temporomandibular joint disorder 01/15/2009   Disease of blood and blood forming organ 06/16/2008   Depression, recurrent (HCC) 05/26/2007   Essential hypertension 05/26/2007   Past Medical History:  Diagnosis Date   Depression    GERD (gastroesophageal reflux disease)    no medications taken for this   Hyperlipidemia    Hypertension    Past  Surgical History:  Procedure Laterality Date   EYE SURGERY     both eyes   Social History   Tobacco Use   Smoking status: Never   Smokeless tobacco: Never  Substance Use Topics   Alcohol use: Yes    Comment: 3 times a week   Drug use: No   Family History  Problem Relation Age of Onset   COPD Mother    Lupus Mother    Heart attack Father    Cancer Brother        bladder and liver   Colon cancer Neg Hx    Esophageal cancer Neg Hx    Stomach cancer Neg Hx    Rectal cancer Neg Hx    Allergies  Allergen Reactions   Amoxicillin     REACTION: unspecified   Penicillins     REACTION: hives      Medications: Outpatient Medications Prior to Visit  Medication Sig   DULoxetine (CYMBALTA) 20 MG capsule TAKE 2 CAPSULES BY MOUTH EVERY DAY   gabapentin (NEURONTIN) 400 MG capsule Take 1 capsule (400 mg total) by mouth 3 (three) times daily.   gabapentin (NEURONTIN) 400 MG capsule TAKE 1 CAPSULE BY MOUTH 3 TIMES DAILY.   olmesartan-hydrochlorothiazide (BENICAR HCT) 40-25 MG tablet TAKE 1 TABLET BY MOUTH EVERY DAY   omeprazole (PRILOSEC) 40 MG capsule TAKE 1 CAPSULE (40 MG TOTAL) BY MOUTH EVERY OTHER DAY   simvastatin (ZOCOR) 80 MG tablet TAKE 1 TABLET BY MOUTH EVERY DAY  No facility-administered medications prior to visit.    Allergies  Allergen Reactions   Amoxicillin     REACTION: unspecified   Penicillins     REACTION: hives    Patient Care Team: Deeann Saint, MD as PCP - General (Family Medicine) Archer Asa, MD as Attending Physician (Psychiatry)  ROS  Objective  BP 122/80 (BP Location: Left Arm, Patient Position: Sitting, Cuff Size: Normal)   Pulse 90   Temp 98.4 F (36.9 C) (Oral)   Ht 5\' 9"  (1.753 m)   Wt 185 lb 9.6 oz (84.2 kg)   SpO2 98%   BMI 27.41 kg/m  BP Readings from Last 3 Encounters:  12/30/23 122/80  10/22/23 100/80  06/17/23 137/88   Wt Readings from Last 3 Encounters:  12/30/23 185 lb 9.6 oz (84.2 kg)  10/22/23 184 lb 3.2 oz  (83.6 kg)  12/29/22 189 lb (85.7 kg)      Physical Exam Constitutional:      Appearance: Normal appearance.  HENT:     Head: Normocephalic and atraumatic.     Right Ear: Tympanic membrane, ear canal and external ear normal.     Left Ear: Tympanic membrane, ear canal and external ear normal.     Nose: Nose normal.     Mouth/Throat:     Mouth: Mucous membranes are moist.     Pharynx: No oropharyngeal exudate or posterior oropharyngeal erythema.  Eyes:     General: No scleral icterus.    Extraocular Movements: Extraocular movements intact.     Conjunctiva/sclera: Conjunctivae normal.     Pupils: Pupils are equal, round, and reactive to light.  Neck:     Thyroid: No thyromegaly.  Cardiovascular:     Rate and Rhythm: Normal rate and regular rhythm.     Pulses: Normal pulses.     Heart sounds: Normal heart sounds. No murmur heard.    No friction rub.  Pulmonary:     Effort: Pulmonary effort is normal.     Breath sounds: Normal breath sounds. No wheezing, rhonchi or rales.  Abdominal:     General: Bowel sounds are normal.     Palpations: Abdomen is soft.     Tenderness: There is no abdominal tenderness.  Musculoskeletal:        General: No deformity. Normal range of motion.  Lymphadenopathy:     Cervical: No cervical adenopathy.  Skin:    General: Skin is warm and dry.     Findings: No lesion.  Neurological:     General: No focal deficit present.     Mental Status: He is alert and oriented to person, place, and time.  Psychiatric:        Mood and Affect: Mood normal.        Thought Content: Thought content normal.     Most recent functional status assessment:     No data to display         Most recent fall risk assessment:    12/30/2023   11:29 AM  Fall Risk   Falls in the past year? 0  Number falls in past yr: 0  Injury with Fall? 0  Risk for fall due to : No Fall Risks  Follow up Falls evaluation completed    Most recent depression screenings:     10/22/2023    2:24 PM 12/29/2022   10:44 AM  PHQ 2/9 Scores  PHQ - 2 Score 0 0  PHQ- 9 Score 1 1   Most recent cognitive  screening:     No data to display        A&Ox3  Vision/Hearing Screen: No results found.    No results found for any visits on 12/30/23.    Assessment & Plan   Annual wellness visit done today including the all of the following: Reviewed patient's Family Medical History Reviewed and updated list of patient's medical providers Assessment of cognitive impairment was done Assessed patient's functional ability Established a written schedule for health screening services Health Risk Assessent Completed and Reviewed  Exercise Activities and Dietary recommendations  Goals   None     Immunization History  Administered Date(s) Administered   Fluad Quad(high Dose 65+) 06/26/2019, 06/08/2022   Influenza Split 06/22/2013   Influenza Whole 06/16/2008   Influenza, High Dose Seasonal PF 06/26/2019, 09/06/2023   Influenza,inj,Quad PF,6+ Mos 07/01/2014, 09/07/2015   Influenza-Unspecified 11/05/2016   MMR 11/22/2023   PFIZER Comirnaty(Gray Top)Covid-19 Tri-Sucrose Vaccine 02/11/2021   PFIZER(Purple Top)SARS-COV-2 Vaccination 11/12/2019, 12/05/2019, 06/27/2020, 07/26/2020   Pfizer Covid-19 Vaccine Bivalent Booster 61yrs & up 06/28/2021   Pneumococcal Conjugate-13 10/28/2018   Pneumococcal Polysaccharide-23 12/22/2019   Respiratory Syncytial Virus Vaccine,Recomb Aduvanted(Arexvy) 08/22/2022   Tdap 03/31/2011, 07/07/2021   Zoster Recombinant(Shingrix) 04/22/2019, 09/06/2019    Health Maintenance  Topic Date Due   Colonoscopy  10/07/2023   COVID-19 Vaccine (7 - 2024-25 season) 01/15/2024 (Originally 05/24/2023)   INFLUENZA VACCINE  04/22/2024   Medicare Annual Wellness (AWV)  12/29/2024   DTaP/Tdap/Td (3 - Td or Tdap) 07/08/2031   Pneumonia Vaccine 79+ Years old  Completed   Hepatitis C Screening  Completed   Zoster Vaccines- Shingrix  Completed   HPV  VACCINES  Aged Out     Discussed health benefits of physical activity, and encouraged him to engage in regular exercise appropriate for his age and condition.    Problem List Items Addressed This Visit       Cardiovascular and Mediastinum   Essential hypertension   Relevant Orders   CBC with Differential/Platelet   Comprehensive metabolic panel with GFR   TSH     Digestive   GERD (gastroesophageal reflux disease)   Relevant Orders   Comprehensive metabolic panel with GFR     Nervous and Auditory   Neuropathy   Relevant Orders   CBC with Differential/Platelet   Vitamin B12   Other Visit Diagnoses       Medicare annual wellness visit, subsequent    -  Primary     B12 deficiency       Relevant Orders   CBC with Differential/Platelet   Vitamin B12     Mixed hyperlipidemia         Pure hypertriglyceridemia       Relevant Orders   Lipid panel     History of depression       Relevant Orders   TSH     Screening for prostate cancer       Relevant Orders   PSA, Medicare     Will place referral to PT to work on balance due to neuropathy.  No med refills needed at this time.  Immunizations up-to-date.  Follow-up with GI for GERD symptoms.  Concern for possible hiatal hernia.  Schedule colonoscopy as last done 10/06/2013.  Return if symptoms worsen or fail to improve.     Deeann Saint, MD

## 2024-01-08 ENCOUNTER — Encounter: Payer: Self-pay | Admitting: Family Medicine

## 2024-01-11 NOTE — Progress Notes (Signed)
 Brigitte Canard, PA-C [address] [place], [place]  [address] Phone: [phone]   Gastroenterology Consultation  Referring Provider:     Viola Greulich, MD Primary Care Physician:  Viola Greulich, MD Primary Gastroenterologist:  Brigitte Canard, PA-C / Dr. Laurell Pond  Reason for Consultation:     Vomiting, GERD        HPI:   Sean Mejia is a 73 y.o. y/o male referred for consultation & management  by Viola Greulich, MD.    He saw his PCP 09/2023 to evaluate worsening GERD symptoms for 4 months.  Omeprazole  40 Mg was not controlling his reflux, however he was only taking it sporadically.  He drinks 3 drinks of alcohol every day (wine / beer).  No tobacco use.  He was advised to take omeprazole  40mg  once daily every day consistently.  Also referred to GI to schedule EGD.  He has never had an EGD.    Current symptoms:  patient states he has taken omeprazole  40 mg once daily intermittently for 5 years.  He has been taking it every day consistently for the past 2 months.  He is not having any heartburn or dysphagia.  However, he continues to have increasing episodes of nausea and vomiting.  He reports episodes of nausea and vomiting on and off for 3 years.  Episodes are becoming more frequent and occurring daily now.  He has episodes of vomiting and retching bile.  This past weekend was his most severe episode on Saturday.  Heat, walking, and hot showers increase his vomiting episodes.  He denies marijuana use.  He reports having a strong gag reflex all of his life.  He had an episode of left upper quadrant abdominal pain yesterday that resolved.  Otherwise he has not had any abdominal pain.  He denies diarrhea, constipation, or bright red rectal bleeding.  Has occasional dark stools.  No use of Pepto-Bismol or iron.  Denies weight loss.  12/30/23 labs: Normal CBC, TSH, and B12.  CMP labs showed mildly elevated AST 56.  All other LFTs normal.  12/2020 RUQ ultrasound: Normal.  No  gallstones.  CBD 2.8 mm.  No liver lesions.  09/2013 last colonoscopy by Dr. Bridgett Camps: Mild diverticulosis, otherwise normal.  No polyps.  10-year repeat screening colonoscopy was recommended (overdue).  Past Medical History:  Diagnosis Date   Depression    GERD (gastroesophageal reflux disease)    no medications taken for this   Hyperlipidemia    Hypertension     Past Surgical History:  Procedure Laterality Date   EYE SURGERY     both eyes   HAND SURGERY Right    HAND SURGERY Left    HAND SURGERY Right    TONSILECTOMY, ADENOIDECTOMY, BILATERAL MYRINGOTOMY AND TUBES      Prior to Admission medications   Medication Sig Start Date End Date Taking? Authorizing Provider  DULoxetine  (CYMBALTA ) 20 MG capsule TAKE 2 CAPSULES BY MOUTH EVERY DAY 01/06/23  Yes Viola Greulich, MD  gabapentin  (NEURONTIN ) 400 MG capsule TAKE 1 CAPSULE BY MOUTH 3 TIMES DAILY. 07/13/23  Yes Regal, Angus Kenning, DPM  Na Sulfate-K Sulfate-Mg Sulfate concentrate (SUPREP) 17.5-3.13-1.6 GM/177ML SOLN Take 1 kit (354 mLs total) by mouth once for 1 dose. 01/12/24 01/12/24 Yes Brigitte Canard, PA-C  olmesartan -hydrochlorothiazide  (BENICAR  HCT) 40-25 MG tablet TAKE 1 TABLET BY MOUTH EVERY DAY 08/18/23  Yes Viola Greulich, MD  omeprazole  (PRILOSEC) 40 MG capsule TAKE 1 CAPSULE (  40 MG TOTAL) BY MOUTH EVERY DAY 08/18/23  Yes Viola Greulich, MD  simvastatin  (ZOCOR ) 80 MG tablet TAKE 1 TABLET BY MOUTH EVERY DAY 11/17/23  Yes Viola Greulich, MD     Family History  Problem Relation Age of Onset   COPD Mother    Lupus Mother    Heart attack Father    Cancer Brother        bladder and liver   Colon cancer Neg Hx    Esophageal cancer Neg Hx    Stomach cancer Neg Hx    Rectal cancer Neg Hx      Social History   Tobacco Use   Smoking status: Never   Smokeless tobacco: Never  Vaping Use   Vaping status: Never Used  Substance Use Topics   Alcohol use: Yes    Comment: 3-4 times a day   Drug use: No    Allergies as of  01/12/2024 - Review Complete 01/12/2024  Allergen Reaction Noted   Amoxicillin  12/25/2006   Penicillins  05/16/2009    Review of Systems:    All systems reviewed and negative except where noted in HPI.   Physical Exam:  BP 124/80 (Cuff Size: Normal)   Pulse 87   Ht 5\' 9"  (1.753 m)   Wt 186 lb 3.2 oz (84.5 kg)   BMI 27.50 kg/m  No LMP for male patient.  General:   Alert,  Well-developed, well-nourished, pleasant and cooperative in NAD Lungs:  Respirations even and unlabored.  Clear throughout to auscultation.   No wheezes, crackles, or rhonchi. No acute distress. Heart:  Regular rate and rhythm; no murmurs, clicks, rubs, or gallops. Abdomen:  Normal bowel sounds.  No bruits.  Soft, and non-distended without masses, hepatosplenomegaly or hernias noted.  No Tenderness.  There is NO upper or lower abdominal tenderness at all.  No guarding or rebound tenderness.    Neurologic:  Alert and oriented x3;  grossly normal neurologically. Psych:  Alert and cooperative. Normal mood and affect.  Imaging Studies: No results found.  Assessment and Plan:   Sean Mejia is a 72 y.o. y/o male has been referred for:  Nausea / Vomiting x 3 years; Increasing occurring daily now. Elevated LFT - AST - Likely due to alcohol use. Moderate Alcohol Use GERD - Improved on Omeprazole  40mg  daily. Colon Cancer Screening - Due for 10 year repeat screening colonoscopy.  Differential for upper GI symptoms includes pancreatitis, gastritis, gastric ulcer, GERD, H. pylori, adverse effects of moderate alcohol use, and biliary dyskinesia.  RUQ ultrasound 12/2020 showed no gallstones.  Never had EGD.  He has no abdominal tenderness on exam today.  Plan: -Labs: CBC, CMP, Lipase -Stool test for H. Pylori -Continue Omeprazole  40mg  1 tablet every day. -Avoid GERD trigger foods / drinks. -Decrease / Limit / Stop alcohol use. -Scheduling EGD & Colonoscopy I discussed risks of EGD and colonoscopy with patient to  include risk of bleeding, perforation, and risk of sedation.  Patient expressed understanding and agrees to proceed with procedures.    Follow up 4 weeks after EGD / Colon.  Brigitte Canard, PA-C

## 2024-01-12 ENCOUNTER — Ambulatory Visit (INDEPENDENT_AMBULATORY_CARE_PROVIDER_SITE_OTHER): Admitting: Physician Assistant

## 2024-01-12 ENCOUNTER — Encounter: Payer: Self-pay | Admitting: Physician Assistant

## 2024-01-12 ENCOUNTER — Other Ambulatory Visit (INDEPENDENT_AMBULATORY_CARE_PROVIDER_SITE_OTHER)

## 2024-01-12 VITALS — BP 124/80 | HR 87 | Ht 69.0 in | Wt 186.2 lb

## 2024-01-12 DIAGNOSIS — R112 Nausea with vomiting, unspecified: Secondary | ICD-10-CM | POA: Diagnosis not present

## 2024-01-12 DIAGNOSIS — R7989 Other specified abnormal findings of blood chemistry: Secondary | ICD-10-CM

## 2024-01-12 DIAGNOSIS — F1090 Alcohol use, unspecified, uncomplicated: Secondary | ICD-10-CM

## 2024-01-12 DIAGNOSIS — Z1211 Encounter for screening for malignant neoplasm of colon: Secondary | ICD-10-CM

## 2024-01-12 DIAGNOSIS — K219 Gastro-esophageal reflux disease without esophagitis: Secondary | ICD-10-CM

## 2024-01-12 DIAGNOSIS — R1012 Left upper quadrant pain: Secondary | ICD-10-CM

## 2024-01-12 LAB — CBC WITH DIFFERENTIAL/PLATELET
Basophils Absolute: 0.1 10*3/uL (ref 0.0–0.1)
Basophils Relative: 2.8 % (ref 0.0–3.0)
Eosinophils Absolute: 0.1 10*3/uL (ref 0.0–0.7)
Eosinophils Relative: 2.9 % (ref 0.0–5.0)
HCT: 43 % (ref 39.0–52.0)
Hemoglobin: 15 g/dL (ref 13.0–17.0)
Lymphocytes Relative: 20.3 % (ref 12.0–46.0)
Lymphs Abs: 0.9 10*3/uL (ref 0.7–4.0)
MCHC: 34.9 g/dL (ref 30.0–36.0)
MCV: 94.5 fl (ref 78.0–100.0)
Monocytes Absolute: 0.5 10*3/uL (ref 0.1–1.0)
Monocytes Relative: 12.8 % — ABNORMAL HIGH (ref 3.0–12.0)
Neutro Abs: 2.6 10*3/uL (ref 1.4–7.7)
Neutrophils Relative %: 61.2 % (ref 43.0–77.0)
Platelets: 315 10*3/uL (ref 150.0–400.0)
RBC: 4.55 Mil/uL (ref 4.22–5.81)
RDW: 13.3 % (ref 11.5–15.5)
WBC: 4.2 10*3/uL (ref 4.0–10.5)

## 2024-01-12 LAB — COMPREHENSIVE METABOLIC PANEL WITH GFR
ALT: 35 U/L (ref 0–53)
AST: 70 U/L — ABNORMAL HIGH (ref 0–37)
Albumin: 4.3 g/dL (ref 3.5–5.2)
Alkaline Phosphatase: 79 U/L (ref 39–117)
BUN: 11 mg/dL (ref 6–23)
CO2: 24 meq/L (ref 19–32)
Calcium: 10 mg/dL (ref 8.4–10.5)
Chloride: 90 meq/L — ABNORMAL LOW (ref 96–112)
Creatinine, Ser: 0.95 mg/dL (ref 0.40–1.50)
GFR: 80.82 mL/min (ref >60.00)
Glucose, Bld: 92 mg/dL (ref 70–99)
Potassium: 3.4 meq/L — ABNORMAL LOW (ref 3.5–5.1)
Sodium: 126 meq/L — ABNORMAL LOW (ref 135–145)
Total Bilirubin: 1 mg/dL (ref 0.2–1.2)
Total Protein: 7.2 g/dL (ref 6.0–8.3)

## 2024-01-12 LAB — LIPASE: Lipase: 37 U/L (ref 11.0–59.0)

## 2024-01-12 MED ORDER — NA SULFATE-K SULFATE-MG SULF 17.5-3.13-1.6 GM/177ML PO SOLN: 1.0000 | Freq: Once | ORAL | 0 refills | Status: AC

## 2024-01-12 MED ORDER — ONDANSETRON 4 MG PO TBDP: 4.0000 mg | ORAL_TABLET | Freq: Four times a day (QID) | ORAL | 1 refills | Status: DC | PRN

## 2024-01-12 NOTE — Addendum Note (Signed)
 Addended by: Brigitte Canard on: 01/12/2024 08:06 PM   Modules accepted: Orders

## 2024-01-12 NOTE — Patient Instructions (Addendum)
 You have been scheduled for an endoscopy and colonoscopy. Please follow the written instructions given to you at your visit today.  If you use inhalers (even only as needed), please bring them with you on the day of your procedure.  DO NOT TAKE 7 DAYS PRIOR TO TEST- Trulicity (dulaglutide) Ozempic, Wegovy (semaglutide) Mounjaro (tirzepatide) Bydureon Bcise (exanatide extended release)  DO NOT TAKE 1 DAY PRIOR TO YOUR TEST Rybelsus (semaglutide) Adlyxin (lixisenatide) Victoza (liraglutide) Byetta (exanatide) ___________________________________________________________________________   Please go to the lab in the basement of our building to have lab work done as you leave today. Hit "B" for basement when you get on the elevator.  When the doors open the lab is on your left.  We will call you with the results. Thank you. Thank you for entrusting me with your care and for choosing [facility], [place] Canard, [place]   If your blood pressure at your visit was 140/90 or greater, please contact your primary care physician to follow up on this. ______________________________________________________  If you are age 53 or older, your body mass index should be between 23-30. Your Body mass index is 27.5 kg/m. If this is out of the aforementioned range listed, please consider follow up with your Primary Care Provider.  If you are age 49 or younger, your body mass index should be between 19-25. Your Body mass index is 27.5 kg/m. If this is out of the aformentioned range listed, please consider follow up with your Primary Care Provider.  ________________________________________________________  The [facility] providers would like to encourage you to use MYCHART to communicate with providers for non-urgent requests or questions.  Due to long hold times on the telephone, sending your provider a message by [facility] may be a faster and more efficient way to get a response.  Please allow 48 business  hours for a response.  Please remember that this is for non-urgent requests.  _______________________________________________________  Due to recent changes in healthcare laws, you may see the results of your imaging and laboratory studies on MyChart before your provider has had a chance to review them.  We understand that in some cases there may be results that are confusing or concerning to you. Not all laboratory results come back in the same time frame and the provider may be waiting for multiple results in order to interpret others.  Please give us  48 hours in order for your provider to thoroughly review all the results before contacting the office for clarification of your results.

## 2024-01-13 ENCOUNTER — Other Ambulatory Visit: Payer: Self-pay | Admitting: *Deleted

## 2024-01-13 ENCOUNTER — Other Ambulatory Visit

## 2024-01-13 DIAGNOSIS — R112 Nausea with vomiting, unspecified: Secondary | ICD-10-CM

## 2024-01-13 DIAGNOSIS — R1012 Left upper quadrant pain: Secondary | ICD-10-CM

## 2024-01-13 DIAGNOSIS — K219 Gastro-esophageal reflux disease without esophagitis: Secondary | ICD-10-CM

## 2024-01-13 DIAGNOSIS — R7989 Other specified abnormal findings of blood chemistry: Secondary | ICD-10-CM

## 2024-01-15 LAB — H. PYLORI ANTIGEN, STOOL: H pylori Ag, Stl: NEGATIVE

## 2024-01-19 NOTE — Telephone Encounter (Signed)
 Contacted patient and reminded him to come within the next couple of days for repeat labs. Patient verbalizes understanding.

## 2024-01-20 ENCOUNTER — Other Ambulatory Visit (INDEPENDENT_AMBULATORY_CARE_PROVIDER_SITE_OTHER)

## 2024-01-20 DIAGNOSIS — K219 Gastro-esophageal reflux disease without esophagitis: Secondary | ICD-10-CM

## 2024-01-20 DIAGNOSIS — R112 Nausea with vomiting, unspecified: Secondary | ICD-10-CM | POA: Diagnosis not present

## 2024-01-20 DIAGNOSIS — R1012 Left upper quadrant pain: Secondary | ICD-10-CM | POA: Diagnosis not present

## 2024-01-20 DIAGNOSIS — R7989 Other specified abnormal findings of blood chemistry: Secondary | ICD-10-CM | POA: Diagnosis not present

## 2024-01-20 LAB — BASIC METABOLIC PANEL WITH GFR
BUN: 13 mg/dL (ref 6–23)
CO2: 27 meq/L (ref 19–32)
Calcium: 9.8 mg/dL (ref 8.4–10.5)
Chloride: 97 meq/L (ref 96–112)
Creatinine, Ser: 1.06 mg/dL (ref 0.40–1.50)
GFR: 70.85 mL/min (ref >60.00)
Glucose, Bld: 86 mg/dL (ref 70–99)
Potassium: 4.2 meq/L (ref 3.5–5.1)
Sodium: 135 meq/L (ref 135–145)

## 2024-01-20 LAB — HEPATIC FUNCTION PANEL
ALT: 40 U/L (ref 0–53)
AST: 67 U/L — ABNORMAL HIGH (ref 0–37)
Albumin: 4.3 g/dL (ref 3.5–5.2)
Alkaline Phosphatase: 83 U/L (ref 39–117)
Bilirubin, Direct: 0.2 mg/dL (ref 0.0–0.3)
Total Bilirubin: 0.7 mg/dL (ref 0.2–1.2)
Total Protein: 7.1 g/dL (ref 6.0–8.3)

## 2024-01-21 ENCOUNTER — Other Ambulatory Visit: Payer: Self-pay | Admitting: Podiatry

## 2024-01-22 ENCOUNTER — Encounter (INDEPENDENT_AMBULATORY_CARE_PROVIDER_SITE_OTHER): Payer: Self-pay

## 2024-01-22 ENCOUNTER — Ambulatory Visit (INDEPENDENT_AMBULATORY_CARE_PROVIDER_SITE_OTHER): Payer: Medicare Other | Admitting: Otolaryngology

## 2024-01-22 VITALS — BP 127/89 | HR 84

## 2024-01-22 DIAGNOSIS — D164 Benign neoplasm of bones of skull and face: Secondary | ICD-10-CM

## 2024-01-22 DIAGNOSIS — H903 Sensorineural hearing loss, bilateral: Secondary | ICD-10-CM

## 2024-01-22 NOTE — Progress Notes (Unsigned)
 Patient ID: Sean Mejia, male   DOB: 1951-04-13, 73 y.o.   MRN: [id number]  Follow-up: Hearing loss, left ear osteoma  HPI: The patient is a 73 year old male who returns today for his follow-up evaluation.  He was last seen 1 year ago.  At that time, he was noted to have bilateral high-frequency sensorineural hearing loss.  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  The patient returns today reporting no significant change in his hearing over the past year.  He also denies any otalgia, otorrhea, or vertigo. No other ENT, GI, or respiratory issue noted since the last visit.   Exam: General: Communicates without difficulty, well nourished, no acute distress. Head: Normocephalic, no evidence injury, no tenderness, facial buttresses intact without stepoff. Eyes: PERRL, EOMI. No scleral icterus, conjunctivae clear. Neuro: CN II exam reveals vision grossly intact.  No nystagmus at any point of gaze. Ears: Auricles well formed without lesions.  Ear canals are intact with a small osteoma on the left.  No erythema or edema is appreciated.  The TMs are intact without fluid. Nose: External evaluation reveals normal support and skin without lesions.  Dorsum is intact.  Anterior rhinoscopy reveals healthy pink mucosa over anterior aspect of inferior turbinates and intact septum.  No purulence noted. Oral:  Oral cavity and oropharynx are intact, symmetric, without erythema or edema.  Mucosa is moist without lesions. Neck: Full range of motion without pain.  There is no significant lymphadenopathy.  No masses palpable.  Thyroid  bed within normal limits to palpation.  Parotid glands and submandibular glands equal bilaterally without mass.  Trachea is midline. Neuro:  CN 2-12 grossly intact. Gait normal. Vestibular: No nystagmus at any point of gaze. The cerebellar examination is unremarkable.   Assessment: 1.  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 2.  Subjectively  stable bilateral high-frequency sensorineural hearing loss. 3.  The rest of his ENT exam is normal.  Plan: 1.  The physical exam findings are reviewed with the patient.  2.  The patient is reassured that his osteoma is stable in size. 3.  The patient will return for follow-up reevaluation in 2 years, sooner if needed.

## 2024-01-25 DIAGNOSIS — D164 Benign neoplasm of bones of skull and face: Secondary | ICD-10-CM | POA: Insufficient documentation

## 2024-01-25 DIAGNOSIS — H903 Sensorineural hearing loss, bilateral: Secondary | ICD-10-CM | POA: Insufficient documentation

## 2024-02-02 ENCOUNTER — Other Ambulatory Visit: Payer: Self-pay | Admitting: Family Medicine

## 2024-02-02 DIAGNOSIS — F339 Major depressive disorder, recurrent, unspecified: Secondary | ICD-10-CM

## 2024-02-08 ENCOUNTER — Telehealth: Payer: Self-pay | Admitting: Physician Assistant

## 2024-02-08 NOTE — Telephone Encounter (Signed)
 Inbound call from patient, states pharmacy has not received prep medication, would like it resent before procedure 5/27 at 2:00 PM.

## 2024-02-08 NOTE — Telephone Encounter (Signed)
 I called [facility] where we sent script for SUPREP last month. They said pt did not picked it up. They indicated they will get it ready again and patient can pick it up this evening or tomorrow.   MyChart message to pt

## 2024-02-10 NOTE — Telephone Encounter (Signed)
 Called and LM for patient to make sure he was able to get his Suprep from [facility].

## 2024-02-16 ENCOUNTER — Ambulatory Visit: Admitting: Internal Medicine

## 2024-02-16 ENCOUNTER — Telehealth: Payer: Self-pay

## 2024-02-16 ENCOUNTER — Other Ambulatory Visit: Payer: Self-pay

## 2024-02-16 VITALS — BP 135/89 | HR 90 | Temp 98.6°F | Resp 17 | Ht 69.0 in | Wt 186.0 lb

## 2024-02-16 DIAGNOSIS — D124 Benign neoplasm of descending colon: Secondary | ICD-10-CM

## 2024-02-16 DIAGNOSIS — K2950 Unspecified chronic gastritis without bleeding: Secondary | ICD-10-CM

## 2024-02-16 DIAGNOSIS — K219 Gastro-esophageal reflux disease without esophagitis: Secondary | ICD-10-CM

## 2024-02-16 DIAGNOSIS — K2289 Other specified disease of esophagus: Secondary | ICD-10-CM | POA: Diagnosis not present

## 2024-02-16 DIAGNOSIS — D125 Benign neoplasm of sigmoid colon: Secondary | ICD-10-CM | POA: Diagnosis not present

## 2024-02-16 DIAGNOSIS — K449 Diaphragmatic hernia without obstruction or gangrene: Secondary | ICD-10-CM

## 2024-02-16 DIAGNOSIS — K2970 Gastritis, unspecified, without bleeding: Secondary | ICD-10-CM

## 2024-02-16 DIAGNOSIS — K635 Polyp of colon: Secondary | ICD-10-CM

## 2024-02-16 DIAGNOSIS — Z1211 Encounter for screening for malignant neoplasm of colon: Secondary | ICD-10-CM | POA: Diagnosis not present

## 2024-02-16 DIAGNOSIS — R112 Nausea with vomiting, unspecified: Secondary | ICD-10-CM

## 2024-02-16 MED ORDER — SODIUM CHLORIDE 0.9 % IV SOLN
500.0000 mL | Freq: Once | INTRAVENOUS | Status: DC
Start: 2024-02-16 — End: 2024-02-16

## 2024-02-16 MED ORDER — OMEPRAZOLE 40 MG PO CPDR: 40.0000 mg | DELAYED_RELEASE_CAPSULE | ORAL | 2 refills | Status: DC

## 2024-02-16 NOTE — Patient Instructions (Signed)
Handouts given on polyps and diverticulosis.  YOU HAD AN ENDOSCOPIC PROCEDURE TODAY AT THE  ENDOSCOPY CENTER:   Refer to the procedure report that was given to you for any specific questions about what was found during the examination.  If the procedure report does not answer your questions, please call your gastroenterologist to clarify.  If you requested that your care partner not be given the details of your procedure findings, then the procedure report has been included in a sealed envelope for you to review at your convenience later.  YOU SHOULD EXPECT: Some feelings of bloating in the abdomen. Passage of more gas than usual.  Walking can help get rid of the air that was put into your GI tract during the procedure and reduce the bloating. If you had a lower endoscopy (such as a colonoscopy or flexible sigmoidoscopy) you may notice spotting of blood in your stool or on the toilet paper. If you underwent a bowel prep for your procedure, you may not have a normal bowel movement for a few days.  Please Note:  You might notice some irritation and congestion in your nose or some drainage.  This is from the oxygen used during your procedure.  There is no need for concern and it should clear up in a day or so.  SYMPTOMS TO REPORT IMMEDIATELY:  Following lower endoscopy (colonoscopy or flexible sigmoidoscopy):  Excessive amounts of blood in the stool  Significant tenderness or worsening of abdominal pains  Swelling of the abdomen that is new, acute  Fever of 100F or higher  Following upper endoscopy (EGD)  Vomiting of blood or coffee ground material  New chest pain or pain under the shoulder blades  Painful or persistently difficult swallowing  New shortness of breath  Fever of 100F or higher  Black, tarry-looking stools  For urgent or emergent issues, a gastroenterologist can be reached at any hour by calling [phone]. Do not use MyChart messaging for urgent concerns.    DIET:   We do recommend a small meal at first, but then you may proceed to your regular diet.  Drink plenty of fluids but you should avoid alcoholic beverages for 24 hours.  ACTIVITY:  You should plan to take it easy for the rest of today and you should NOT DRIVE or use heavy machinery until tomorrow (because of the sedation medicines used during the test).    FOLLOW UP: Our staff will call the number listed on your records the next business day following your procedure.  We will call around 7:15- 8:00 am to check on you and address any questions or concerns that you may have regarding the information given to you following your procedure. If we do not reach you, we will leave a message.     If any biopsies were taken you will be contacted by phone or by letter within the next 1-3 weeks.  Please call us at [phone] if you have not heard about the biopsies in 3 weeks.    SIGNATURES/CONFIDENTIALITY: You and/or your care partner have signed paperwork which will be entered into your electronic medical record.  These signatures attest to the fact that that the information above on your After Visit Summary has been reviewed and is understood.  Full responsibility of the confidentiality of this discharge information lies with you and/or your care-partner.

## 2024-02-16 NOTE — Telephone Encounter (Signed)
 Copied from CRM [id number]. Topic: Clinical - Medication Question >> Feb 16, 2024  9:05 AM Jenna Moan wrote: Reason for CRM: Insurance is saying that patient can not get his refill for omeprazole  (PRILOSEC) 40 MG capsule until August and patient takes it daily, and patient is completely  out would like Dr Arliss Lam or a Nurse to help with the refill

## 2024-02-16 NOTE — Op Note (Signed)
 [facility] Patient Name: Sean Mejia Procedure Date: 02/16/2024 3:18 PM MRN: [id number] Endoscopist: Nannette Babe , MD, [id number] Age: 73 Referring MD:  Date of Birth: [birth year] Gender: Male Account #: [id number] Procedure:                Colonoscopy Indications:              Screening for colorectal malignant neoplasm, Last                            colonoscopy: January 2015 Medicines:                Monitored Anesthesia Care Procedure:                Pre-Anesthesia Assessment:                           - Prior to the procedure, a History and Physical                            was performed, and patient medications and                            allergies were reviewed. The patient's tolerance of                            previous anesthesia was also reviewed. The risks                            and benefits of the procedure and the sedation                            options and risks were discussed with the patient.                            All questions were answered, and informed consent                            was obtained. Prior Anticoagulants: The patient has                            taken no anticoagulant or antiplatelet agents. ASA                            Grade Assessment: II - A patient with mild systemic                            disease. After reviewing the risks and benefits,                            the patient was deemed in satisfactory condition to                            undergo the procedure.  After obtaining informed consent, the colonoscope                            was passed under direct vision. Throughout the                            procedure, the patient's blood pressure, pulse, and                            oxygen saturations were monitored continuously. The                            CF [id number] #[id number] was introduced through the anus                            and advanced to the terminal  ileum. The colonoscopy                            was performed without difficulty. The patient                            tolerated the procedure well. The quality of the                            bowel preparation was good. The terminal ileum,                            ileocecal valve, appendiceal orifice, and rectum                            were photographed. Scope In: 3:30:02 PM Scope Out: 3:48:57 PM Scope Withdrawal Time: 0 hours 16 minutes 51 seconds  Total Procedure Duration: 0 hours 18 minutes 55 seconds  Findings:                 The digital rectal exam was normal.                           The terminal ileum appeared normal.                           A 2 mm polyp was found in the descending colon. The                            polyp was sessile. The polyp was removed with a                            cold biopsy forceps. Resection and retrieval were                            complete.                           An 18 mm polyp was found in the distal sigmoid  colon. The polyp was sessile. The polyp was removed                            with a cold snare. Resection and retrieval were                            complete.                           Multiple small-mouthed diverticula were found in                            the sigmoid colon, ascending colon and cecum.                           The retroflexed view of the distal rectum and anal                            verge was normal and showed no anal or rectal                            abnormalities. Complications:            No immediate complications. Estimated Blood Loss:     Estimated blood loss was minimal. Impression:               - The examined portion of the ileum was normal.                           - One 2 mm polyp in the descending colon, removed                            with a cold biopsy forceps. Resected and retrieved.                           - One 18 mm polyp in the distal  sigmoid colon,                            removed with a cold snare. Resected and retrieved.                           - Mild diverticulosis in the sigmoid colon, in the                            ascending colon and in the cecum.                           - The distal rectum and anal verge are normal on                            retroflexion view. Recommendation:           - Patient has a contact number available for  emergencies. The signs and symptoms of potential                            delayed complications were discussed with the                            patient. Return to normal activities tomorrow.                            Written discharge instructions were provided to the                            patient.                           - Resume previous diet.                           - Continue present medications.                           - Await pathology results.                           - Repeat colonoscopy is recommended for                            surveillance. The colonoscopy date will be                            determined after pathology results from today's                            exam become available for review. Nannette Babe, MD 02/16/2024 3:57:17 PM This report has been signed electronically.

## 2024-02-16 NOTE — Telephone Encounter (Signed)
 medication has been sent to the Pharmacy

## 2024-02-16 NOTE — Progress Notes (Signed)
 Pt's states no medical or surgical changes since previsit or office visit.

## 2024-02-16 NOTE — Op Note (Signed)
 [facility] Patient Name: Sean Mejia Procedure Date: 02/16/2024 3:19 PM MRN: [id number] Endoscopist: Nannette Babe , MD, [id number] Age: 73 Referring MD:  Date of Birth: 1951-08-09 Gender: Male Account #: [id number] Procedure:                Upper GI endoscopy Indications:              Suspected gastro-esophageal reflux disease, Nausea                            with vomiting -- currently on omeprazole  40 mg daily Medicines:                Monitored Anesthesia Care Procedure:                Pre-Anesthesia Assessment:                           - Prior to the procedure, a History and Physical                            was performed, and patient medications and                            allergies were reviewed. The patient's tolerance of                            previous anesthesia was also reviewed. The risks                            and benefits of the procedure and the sedation                            options and risks were discussed with the patient.                            All questions were answered, and informed consent                            was obtained. Prior Anticoagulants: The patient has                            taken no anticoagulant or antiplatelet agents. ASA                            Grade Assessment: II - A patient with mild systemic                            disease. After reviewing the risks and benefits,                            the patient was deemed in satisfactory condition to                            undergo the procedure.  After obtaining informed consent, the endoscope was                            passed under direct vision. Throughout the                            procedure, the patient's blood pressure, pulse, and                            oxygen saturations were monitored continuously. The                            Olympus Scope [id number] was introduced through the                             mouth, and advanced to the second part of duodenum.                            The upper GI endoscopy was accomplished without                            difficulty. The patient tolerated the procedure                            well. Scope In: Scope Out: Findings:                 Mild mucosal changes characterized by mucosal                            sloughing were found in the distal esophagus.                            Biopsies were taken with a cold forceps for                            histology.                           A 2 cm hiatal hernia was present.                           The gastroesophageal flap valve was visualized                            endoscopically and classified as Hill Grade IV (no                            fold, wide open lumen, hiatal hernia present).                           Diffuse mild inflammation characterized by                            congestion (edema), erythema and granularity was  found in the gastric body and in the gastric                            antrum. Biopsies were taken with a cold forceps for                            histology.                           The examined duodenum was normal. Complications:            No immediate complications. Estimated Blood Loss:     Estimated blood loss was minimal. Impression:               - Mucosal sloughing in the distal esophagus, query                            reflux related. No evidence of Barrett's esophagus.                            Biopsied.                           - 2 cm hiatal hernia.                           - Gastritis. Biopsied.                           - Normal examined duodenum. Recommendation:           - Patient has a contact number available for                            emergencies. The signs and symptoms of potential                            delayed complications were discussed with the                            patient. Return to  normal activities tomorrow.                            Written discharge instructions were provided to the                            patient.                           - Resume previous diet.                           - Continue present medications.                           - Await pathology results.                           -  Follow-up with Brigitte Canard, PAC in 4-8 weeks. Nannette Babe, MD 02/16/2024 3:53:55 PM This report has been signed electronically.

## 2024-02-16 NOTE — Progress Notes (Signed)
 Sedate, gd SR, tolerated procedure well, VSS, report to RN

## 2024-02-16 NOTE — Progress Notes (Signed)
 GASTROENTEROLOGY PROCEDURE H&P NOTE   Primary Care Physician: Viola Greulich, MD    Reason for Procedure:  Nausea, vomiting, GERD and colon cancer screening  Plan:    EGD and colonoscopy  Patient is appropriate for endoscopic procedure(s) in the ambulatory ([facility]) setting.  The nature of the procedure, as well as the risks, benefits, and alternatives were carefully and thoroughly reviewed with the patient. Ample time for discussion and questions allowed. The patient understood, was satisfied, and agreed to proceed.     HPI: Sean Mejia is a 73 y.o. male who presents for EGD and colonoscopy.  Medical history as below.  Tolerated the prep.  No recent chest pain or shortness of breath.  No abdominal pain today.  Past Medical History:  Diagnosis Date   Depression    GERD (gastroesophageal reflux disease)    no medications taken for this   Hyperlipidemia    Hypertension     Past Surgical History:  Procedure Laterality Date   EYE SURGERY     both eyes   HAND SURGERY Right    HAND SURGERY Left    HAND SURGERY Right    TONSILECTOMY, ADENOIDECTOMY, BILATERAL MYRINGOTOMY AND TUBES      Prior to Admission medications   Medication Sig Start Date End Date Taking? Authorizing Provider  DULoxetine  (CYMBALTA ) 20 MG capsule TAKE 2 CAPSULES BY MOUTH EVERY DAY 02/02/24  Yes Viola Greulich, MD  gabapentin  (NEURONTIN ) 400 MG capsule TAKE 1 CAPSULE BY MOUTH 3 TIMES DAILY. 01/25/24  Yes Regal, Angus Kenning, DPM  olmesartan -hydrochlorothiazide  (BENICAR  HCT) 40-25 MG tablet TAKE 1 TABLET BY MOUTH EVERY DAY 08/18/23  Yes Viola Greulich, MD  omeprazole  (PRILOSEC) 40 MG capsule Take 1 capsule (40 mg total) by mouth every other day. 02/16/24  Yes Viola Greulich, MD  ondansetron  (ZOFRAN -ODT) 4 MG disintegrating tablet Take 1 tablet (4 mg total) by mouth every 6 (six) hours as needed for nausea or vomiting. 01/12/24  Yes Brigitte Canard, PA-C  simvastatin  (ZOCOR ) 80 MG tablet TAKE 1 TABLET BY  MOUTH EVERY DAY 11/17/23  Yes Viola Greulich, MD    Current Outpatient Medications  Medication Sig Dispense Refill   DULoxetine  (CYMBALTA ) 20 MG capsule TAKE 2 CAPSULES BY MOUTH EVERY DAY 180 capsule 1   gabapentin  (NEURONTIN ) 400 MG capsule TAKE 1 CAPSULE BY MOUTH 3 TIMES DAILY. 90 capsule 5   olmesartan -hydrochlorothiazide  (BENICAR  HCT) 40-25 MG tablet TAKE 1 TABLET BY MOUTH EVERY DAY 90 tablet 2   omeprazole  (PRILOSEC) 40 MG capsule Take 1 capsule (40 mg total) by mouth every other day. 45 capsule 2   ondansetron  (ZOFRAN -ODT) 4 MG disintegrating tablet Take 1 tablet (4 mg total) by mouth every 6 (six) hours as needed for nausea or vomiting. 30 tablet 1   simvastatin  (ZOCOR ) 80 MG tablet TAKE 1 TABLET BY MOUTH EVERY DAY 90 tablet 1   Current Facility-Administered Medications  Medication Dose Route Frequency Provider Last Rate Last Admin   0.9 %  sodium chloride  infusion  500 mL Intravenous Once Basilia Stuckert, Amber Bail, MD        Allergies as of 02/16/2024 - Review Complete 02/16/2024  Allergen Reaction Noted   Amoxicillin Other (See Comments) 12/25/2006   Penicillins Other (See Comments) 05/16/2009    Family History  Problem Relation Age of Onset   COPD Mother    Lupus Mother    Heart attack Father    Cancer Brother        bladder  and liver   Colon cancer Neg Hx    Esophageal cancer Neg Hx    Stomach cancer Neg Hx    Rectal cancer Neg Hx     Social History   Socioeconomic History   Marital status: Divorced    Spouse name: Not on file   Number of children: Not on file   Years of education: Not on file   Highest education level: Doctorate  Occupational History   Not on file  Tobacco Use   Smoking status: Never   Smokeless tobacco: Never  Vaping Use   Vaping status: Never Used  Substance and Sexual Activity   Alcohol use: Yes    Comment: 3-4 times a day   Drug use: No   Sexual activity: Not on file  Other Topics Concern   Not on file  Social History Narrative   Not on  file   Social Drivers of Health   Financial Resource Strain: Low Risk  (10/21/2023)   Overall Financial Resource Strain (CARDIA)    Difficulty of Paying Living Expenses: Not hard at all  Food Insecurity: No Food Insecurity (10/21/2023)   Hunger Vital Sign    Worried About Running Out of Food in the Last Year: Never true    Ran Out of Food in the Last Year: Never true  Transportation Needs: No Transportation Needs (10/21/2023)   PRAPARE - Administrator, Civil Service (Medical): No    Lack of Transportation (Non-Medical): No  Physical Activity: Insufficiently Active (10/21/2023)   Exercise Vital Sign    Days of Exercise per Week: 1 day    Minutes of Exercise per Session: 30 min  Stress: No Stress Concern Present (10/21/2023)   [employer] of Occupational Health - Occupational Stress Questionnaire    Feeling of Stress : Only a little  Social Connections: Moderately Isolated (10/21/2023)   Social Connection and Isolation Panel [NHANES]    Frequency of Communication with Friends and Family: Twice a week    Frequency of Social Gatherings with Friends and Family: Once a week    Attends Religious Services: Never    Database administrator or Organizations: Yes    Attends Engineer, structural: 1 to 4 times per year    Marital Status: Divorced  Catering manager Violence: Not on file    Physical Exam: Vital signs in last 24 hours: @BP  (!) 142/102   Pulse 90   Temp 98.6 F (37 C)   Ht 5\' 9"  (1.753 m)   Wt 186 lb (84.4 kg)   SpO2 98%   BMI 27.47 kg/m  GEN: NAD EYE: Sclerae anicteric ENT: MMM CV: Non-tachycardic Pulm: CTA b/l GI: Soft, NT/ND NEURO:  Alert & Oriented x 3   Laurell Pond, MD [facility]  02/16/2024 3:05 PM

## 2024-02-16 NOTE — Progress Notes (Signed)
 Called to room to assist during endoscopic procedure.  Patient ID and intended procedure confirmed with present staff. Received instructions for my participation in the procedure from the performing physician.

## 2024-02-17 ENCOUNTER — Telehealth: Payer: Self-pay

## 2024-02-17 NOTE — Telephone Encounter (Signed)
 Attempted to reach patient for follow up phone call. No answer, left voicemail to call Dr. Marietta Shorter office with any questions or concerns.

## 2024-02-22 ENCOUNTER — Encounter: Payer: Self-pay | Admitting: *Deleted

## 2024-02-22 ENCOUNTER — Other Ambulatory Visit: Payer: Self-pay | Admitting: *Deleted

## 2024-02-22 DIAGNOSIS — R7989 Other specified abnormal findings of blood chemistry: Secondary | ICD-10-CM

## 2024-02-23 ENCOUNTER — Encounter: Payer: Self-pay | Admitting: Family Medicine

## 2024-02-23 LAB — SURGICAL PATHOLOGY

## 2024-02-24 ENCOUNTER — Ambulatory Visit: Payer: Self-pay | Admitting: Internal Medicine

## 2024-02-29 ENCOUNTER — Telehealth: Payer: Self-pay

## 2024-02-29 ENCOUNTER — Other Ambulatory Visit: Payer: Self-pay | Admitting: Family Medicine

## 2024-02-29 DIAGNOSIS — K219 Gastro-esophageal reflux disease without esophagitis: Secondary | ICD-10-CM

## 2024-02-29 MED ORDER — OMEPRAZOLE 40 MG PO CPDR: 40.0000 mg | DELAYED_RELEASE_CAPSULE | ORAL | 3 refills | Status: DC

## 2024-02-29 NOTE — Telephone Encounter (Signed)
 Copied from CRM [id number]. Topic: Clinical - Prescription Issue >> Feb 29, 2024 11:26 AM Bambi Bonine D wrote: Reason for CRM: Danney Dutton with [facility] stated that the pt was advised by Dr.Banks to start taking the omeprazole  daily instead of every other day and now the pt stated they have no medication left and needs a refill. Danney Dutton stated that the pt has to wait until 6/19 for a refill and the medication also needs to be updated to take it every day. Danney Dutton also stated that the medication is tier 2 and doesn't need a PA.

## 2024-02-29 NOTE — Telephone Encounter (Signed)
 Refill sent to pharmacy.

## 2024-03-16 ENCOUNTER — Telehealth: Payer: Self-pay | Admitting: Internal Medicine

## 2024-03-16 DIAGNOSIS — R112 Nausea with vomiting, unspecified: Secondary | ICD-10-CM

## 2024-03-16 MED ORDER — ONDANSETRON 4 MG PO TBDP: 4.0000 mg | ORAL_TABLET | Freq: Four times a day (QID) | ORAL | 0 refills | Status: DC | PRN

## 2024-03-16 NOTE — Telephone Encounter (Signed)
 Limited script sent to pharmacy until his follow up with Elida Nyle Sharps, NP on 04/06/24.

## 2024-03-16 NOTE — Telephone Encounter (Signed)
 Patient calling requesting an ongoing prescription for zofran . Please advise.   Thank you

## 2024-03-23 ENCOUNTER — Ambulatory Visit: Admitting: Physician Assistant

## 2024-04-06 ENCOUNTER — Ambulatory Visit: Admitting: Nurse Practitioner

## 2024-04-14 ENCOUNTER — Other Ambulatory Visit: Payer: Self-pay | Admitting: Internal Medicine

## 2024-04-14 DIAGNOSIS — R112 Nausea with vomiting, unspecified: Secondary | ICD-10-CM

## 2024-04-18 ENCOUNTER — Telehealth: Payer: Self-pay | Admitting: Internal Medicine

## 2024-04-18 DIAGNOSIS — R112 Nausea with vomiting, unspecified: Secondary | ICD-10-CM

## 2024-04-18 NOTE — Telephone Encounter (Signed)
 Patient is requesting a refill for zofran  patient was rescheduled for 9/29. Please advise, Thank you.

## 2024-04-19 MED ORDER — ONDANSETRON 4 MG PO TBDP: 4.0000 mg | ORAL_TABLET | Freq: Four times a day (QID) | ORAL | 0 refills | Status: DC | PRN

## 2024-04-19 NOTE — Telephone Encounter (Signed)
 Prescription for Zofran  sent to patient's pharmacy until scheduled appt on 9/29.

## 2024-05-09 ENCOUNTER — Other Ambulatory Visit: Payer: Self-pay | Admitting: Family Medicine

## 2024-05-09 ENCOUNTER — Other Ambulatory Visit: Payer: Self-pay | Admitting: Internal Medicine

## 2024-05-09 DIAGNOSIS — I10 Essential (primary) hypertension: Secondary | ICD-10-CM

## 2024-05-09 DIAGNOSIS — R112 Nausea with vomiting, unspecified: Secondary | ICD-10-CM

## 2024-05-09 DIAGNOSIS — E781 Pure hyperglyceridemia: Secondary | ICD-10-CM

## 2024-06-15 ENCOUNTER — Telehealth: Payer: Self-pay | Admitting: Internal Medicine

## 2024-06-15 DIAGNOSIS — R112 Nausea with vomiting, unspecified: Secondary | ICD-10-CM

## 2024-06-15 MED ORDER — ONDANSETRON 4 MG PO TBDP: 4.0000 mg | ORAL_TABLET | Freq: Four times a day (QID) | ORAL | 0 refills | Status: DC | PRN

## 2024-06-15 NOTE — Telephone Encounter (Signed)
 Prescription sent to patient's pharmacy.

## 2024-06-15 NOTE — Telephone Encounter (Signed)
 Inbound call from patient stating that he has ran out of his medication called ondansetron  4MG  and he will be needing to pick it up today due to him leaving for vacation tomorrow. Patient is schedule to have a follow appointment for October the 14 th. Please advise.

## 2024-06-20 ENCOUNTER — Ambulatory Visit: Admitting: Nurse Practitioner

## 2024-06-29 ENCOUNTER — Encounter: Payer: Self-pay | Admitting: Podiatry

## 2024-06-29 ENCOUNTER — Ambulatory Visit: Admitting: Podiatry

## 2024-06-29 DIAGNOSIS — G629 Polyneuropathy, unspecified: Secondary | ICD-10-CM

## 2024-06-29 DIAGNOSIS — I7300 Raynaud's syndrome without gangrene: Secondary | ICD-10-CM | POA: Diagnosis not present

## 2024-06-29 DIAGNOSIS — R2681 Unsteadiness on feet: Secondary | ICD-10-CM | POA: Diagnosis not present

## 2024-06-30 ENCOUNTER — Ambulatory Visit: Admitting: Family Medicine

## 2024-06-30 ENCOUNTER — Encounter: Payer: Self-pay | Admitting: Family Medicine

## 2024-06-30 VITALS — BP 146/94 | HR 95 | Temp 98.5°F | Ht 69.0 in | Wt 193.8 lb

## 2024-06-30 DIAGNOSIS — I10 Essential (primary) hypertension: Secondary | ICD-10-CM

## 2024-06-30 DIAGNOSIS — E782 Mixed hyperlipidemia: Secondary | ICD-10-CM

## 2024-06-30 DIAGNOSIS — G629 Polyneuropathy, unspecified: Secondary | ICD-10-CM | POA: Diagnosis not present

## 2024-06-30 DIAGNOSIS — R296 Repeated falls: Secondary | ICD-10-CM | POA: Diagnosis not present

## 2024-06-30 DIAGNOSIS — E538 Deficiency of other specified B group vitamins: Secondary | ICD-10-CM

## 2024-06-30 DIAGNOSIS — R251 Tremor, unspecified: Secondary | ICD-10-CM

## 2024-06-30 DIAGNOSIS — F339 Major depressive disorder, recurrent, unspecified: Secondary | ICD-10-CM | POA: Diagnosis not present

## 2024-06-30 NOTE — Progress Notes (Signed)
 Established Patient Office Visit   Subjective  Patient ID: Sean Mejia, male    DOB: 1951-06-23  Age: 73 y.o. MRN: [id number]  Chief Complaint  Patient presents with   Acute Visit    Patient came in today for tremors, falls- 2 weeks ago, and 3 days ago, Bilateral Neuropathy in feet,     Pt is a 73 yo male seen for acute concerns.  Pt endoreses increased falls and tremors. Pt endorses worsening of neuropathy over the past year, with increased loss of sensation in his feet, now extending to his ankles. He describes leg tremors, particularly when stressed, and hand tremors for the past two to three months. He has fallen approximately eight times in the past year, with the most recent fall resulting in a concussion and a four-day headache. He feels weak and feeble, with falls primarily occurring in his condominium, often when navigating corners.  There is a family history of tremors, as both of his brothers experience hand tremors and take propranolol. He inquires about the medication, noting that his tremors are not constant but seem to be stress-related. He also mentions occasional numbness and tingling in his hands, though not as severe as in his feet.  Often trips while walking his dog. He has undergone physical therapy and performs balance exercises, though he questions their effectiveness due to continued falls.  He experiences occasional headaches, generally relieved by Advil, with a slight increase in frequency since his concussion. He also notes dizziness when stressed and his legs are shaking, but denies any stiffness or significant changes in vision aside from needing cataract surgery.  He has a history of acid reflux and was recently diagnosed with a hiatal hernia, for which he takes medication. He experiences daily episodes of nausea and retching, particularly in the afternoon, and takes ondansetron  for nausea management.  History of Dupuytren's contracture, having undergone  multiple surgeries on both hands. The condition has not worsened in recent years, though he experiences some limitations in hand movement.    Patient Active Problem List   Diagnosis Date Noted   Sensorineural hearing loss, bilateral 01/25/2024   Osteoma of external auditory canal 01/25/2024   Neuropathy 12/29/2022   GERD (gastroesophageal reflux disease) 05/31/2014   Other testicular hypofunction 12/18/2011   Dyslipidemia 05/16/2009   DECREASED LIBIDO 05/16/2009   Temporomandibular joint disorder 01/15/2009   Disease of blood and blood forming organ 06/16/2008   Depression, recurrent (HCC) 05/26/2007   Essential hypertension 05/26/2007   Past Medical History:  Diagnosis Date   Depression    GERD (gastroesophageal reflux disease)    no medications taken for this   Hyperlipidemia    Hypertension    Substance abuse (HCC) alcohol   Past Surgical History:  Procedure Laterality Date   COLONOSCOPY     EYE SURGERY     both eyes   HAND SURGERY Right    HAND SURGERY Left    HAND SURGERY Right    TONSILECTOMY, ADENOIDECTOMY, BILATERAL MYRINGOTOMY AND TUBES     UPPER GASTROINTESTINAL ENDOSCOPY     Social History   Tobacco Use   Smoking status: Never   Smokeless tobacco: Never  Vaping Use   Vaping status: Never Used  Substance Use Topics   Alcohol use: Yes    Comment: 3-4 times a day   Drug use: No   Family History  Problem Relation Age of Onset   COPD Mother    Lupus Mother    Heart attack Father  Cancer Brother        bladder and liver   Alcohol abuse Brother    Stroke Maternal Grandmother    Colon cancer Neg Hx    Esophageal cancer Neg Hx    Stomach cancer Neg Hx    Rectal cancer Neg Hx    Allergies  Allergen Reactions   Amoxicillin Other (See Comments)    REACTION: unspecified   Penicillins Other (See Comments)    REACTION: hives    ROS Negative unless stated above    Objective:     BP (!) 146/94 (BP Location: Left Arm, Patient Position:  Sitting, Cuff Size: Large)   Pulse 95   Temp 98.5 F (36.9 C) (Oral)   Ht 5' 9 (1.753 m)   Wt 193 lb 12.8 oz (87.9 kg)   SpO2 98%   BMI 28.62 kg/m  BP Readings from Last 3 Encounters:  07/05/24 122/80  06/30/24 (!) 146/94  02/16/24 135/89   Wt Readings from Last 3 Encounters:  07/05/24 187 lb (84.8 kg)  06/30/24 193 lb 12.8 oz (87.9 kg)  02/16/24 186 lb (84.4 kg)      Physical Exam Constitutional:      General: He is not in acute distress.    Appearance: Normal appearance.  HENT:     Head: Normocephalic and atraumatic.     Nose: Nose normal.     Mouth/Throat:     Mouth: Mucous membranes are moist.  Cardiovascular:     Rate and Rhythm: Normal rate and regular rhythm.     Heart sounds: Normal heart sounds. No murmur heard.    No gallop.  Pulmonary:     Effort: Pulmonary effort is normal. No respiratory distress.     Breath sounds: Normal breath sounds. No wheezing, rhonchi or rales.  Skin:    General: Skin is warm and dry.  Neurological:     Mental Status: He is alert and oriented to person, place, and time.     Cranial Nerves: Cranial nerves 2-12 are intact.     Motor: Weakness and tremor present. No abnormal muscle tone or pronator drift.     Coordination: Coordination abnormal. Finger-Nose-Finger Test normal.     Gait: Gait abnormal.     Deep Tendon Reflexes:     Reflex Scores:      Patellar reflexes are 2+ on the right side and 1+ on the left side.    Comments: Difficulty eliciting DTR in b/l brachioradialis, biceps, and triceps.        06/30/2024    5:10 PM 12/30/2023    1:13 PM 10/22/2023    2:24 PM  Depression screen PHQ 2/9  Decreased Interest 1 0 0  Down, Depressed, Hopeless 3 0 0  PHQ - 2 Score 4 0 0  Altered sleeping 3 1 1   Tired, decreased energy 2 0 0  Change in appetite 0 0 0  Feeling bad or failure about yourself  3 0 0  Trouble concentrating 0 0 0  Moving slowly or fidgety/restless 0 0 0  Suicidal thoughts 1 0 0  PHQ-9 Score 13 1 1    Difficult doing work/chores Somewhat difficult        06/30/2024    5:09 PM 12/30/2023    1:14 PM 10/22/2023    2:24 PM 12/29/2022   10:45 AM  GAD 7 : Generalized Anxiety Score  Nervous, Anxious, on Edge 3 0 0 0  Control/stop worrying 3 0 0 0  Worry too much - different  things 2 0 0 0  Trouble relaxing 2 0 0 0  Restless 0 0 0 0  Easily annoyed or irritable 2 0 0 0  Afraid - awful might happen 2 0 0 0  Total GAD 7 Score 14 0 0 0  Anxiety Difficulty Somewhat difficult Not difficult at all Not difficult at all Not difficult at all      Assessment & Plan:   Neuropathy -     CBC with Differential/Platelet; Future -     Comprehensive metabolic panel with GFR; Future -     Folate; Future -     C-reactive protein; Future -     Vitamin B12; Future -     Ambulatory referral to Neurology -     Lyme Disease Serology w/Reflex; Future  Tremor  Falls frequently -     For home use only DME Shower stool -     TSH; Future -     Ambulatory referral to Neurology -     Lyme Disease Serology w/Reflex; Future  Essential hypertension -     Comprehensive metabolic panel with GFR; Future -     TSH; Future  Depression, recurrent -     TSH; Future  B12 deficiency -     Vitamin B12; Future  Mixed hyperlipidemia -     Lipid panel; Future  Increased falls, tremor, and worsening neuropathy.  Discussed concern for vitamin or electrolyte abnormality, neurologic d/o such as Parkinson's, essential tremor, also consider MS, or other autoimmune d/o.  Will obtain labs.  Consider imaging based on results.  Rx for shower stool given.  Referral to Neuro placed.   Increased anxiety and depression sx, likely worsened by current health challenges.  Continue cymbalta , neurontin .  Counseling encouraged.  Discussed medication adjustment based on results.  Continue simvastatin  80 mg.  BP elevated likely due to increased anxiety.  Continue olmesaran-hydrochlorothiazide  40-25 mg daily.  Lifestyle  modifications.  Return in about 6 weeks (around 08/11/2024).   I personally spent a total of 43 minutes in the care of the patient today including getting/reviewing separately obtained history, performing a medically appropriate exam/evaluation, counseling and educating, placing orders, referring and communicating with other health care professionals, documenting clinical information in the EHR, independently interpreting results, communicating results, and coordinating care.   Clotilda JONELLE Single, MD

## 2024-06-30 NOTE — Progress Notes (Signed)
 Subjective:   Patient ID: Sean Mejia, male   DOB: 73 y.o.   MRN: [id number]   HPI Patient has had severe neuropathy bilateral vascular issues and gait instability.  Sean Mejia has had 6 different falls Sean Mejia also has currently tremors that Sean Mejia is not sure as to what may be causing   ROS      Objective:  Physical Exam  Vascular status mildly diminished but intact neurologically severe loss of sensation bilateral with patient having complete gait instability now using a cane but has had 6 different falls and has numerous cuts on his head.     Assessment:  This is a difficult condition and this may be some form of Parkinson's or other pathology from that standpoint and also the significant increase in gait instability with patient having numerous falls      Plan:  H&P reviewed and at this point I have recommended a neurologist for him to see and I did discuss his gait instability and we are going to cast him for balance bracing to try to provide stability for him and hopefully prevent some of the falls that Sean Mejia is experiencing.  Patient will be seen back when we have availability of pedorthist for this patient.  I discussed with him also consideration for walker

## 2024-07-01 LAB — COMPREHENSIVE METABOLIC PANEL WITH GFR
ALT: 59 U/L — ABNORMAL HIGH (ref 0–53)
AST: 68 U/L — ABNORMAL HIGH (ref 0–37)
Albumin: 4.3 g/dL (ref 3.5–5.2)
Alkaline Phosphatase: 103 U/L (ref 39–117)
BUN: 9 mg/dL (ref 6–23)
CO2: 26 meq/L (ref 19–32)
Calcium: 9.6 mg/dL (ref 8.4–10.5)
Chloride: 89 meq/L — ABNORMAL LOW (ref 96–112)
Creatinine, Ser: 0.85 mg/dL (ref 0.40–1.50)
GFR: 87.45 mL/min (ref >60.00)
Glucose, Bld: 64 mg/dL — ABNORMAL LOW (ref 70–99)
Potassium: 4.3 meq/L (ref 3.5–5.1)
Sodium: 127 meq/L — ABNORMAL LOW (ref 135–145)
Total Bilirubin: 0.8 mg/dL (ref 0.2–1.2)
Total Protein: 7.1 g/dL (ref 6.0–8.3)

## 2024-07-01 LAB — CBC WITH DIFFERENTIAL/PLATELET
Basophils Absolute: 0 K/uL (ref 0.0–0.1)
Basophils Relative: 0.7 % (ref 0.0–3.0)
Eosinophils Absolute: 0.1 K/uL (ref 0.0–0.7)
Eosinophils Relative: 1.7 % (ref 0.0–5.0)
HCT: 39.8 % (ref 39.0–52.0)
Hemoglobin: 13.8 g/dL (ref 13.0–17.0)
Lymphocytes Relative: 19.5 % (ref 12.0–46.0)
Lymphs Abs: 1 K/uL (ref 0.7–4.0)
MCHC: 34.6 g/dL (ref 30.0–36.0)
MCV: 96.4 fl (ref 78.0–100.0)
Monocytes Absolute: 0.5 K/uL (ref 0.1–1.0)
Monocytes Relative: 9.3 % (ref 3.0–12.0)
Neutro Abs: 3.5 K/uL (ref 1.4–7.7)
Neutrophils Relative %: 68.8 % (ref 43.0–77.0)
Platelets: 391 K/uL (ref 150.0–400.0)
RBC: 4.12 Mil/uL — ABNORMAL LOW (ref 4.22–5.81)
RDW: 13.6 % (ref 11.5–15.5)
WBC: 5.1 K/uL (ref 4.0–10.5)

## 2024-07-01 LAB — LIPID PANEL
Cholesterol: 140 mg/dL (ref 0–200)
HDL: 68.6 mg/dL (ref >39.00)
LDL Cholesterol: 56 mg/dL (ref 0–99)
NonHDL: 71.09
Total CHOL/HDL Ratio: 2
Triglycerides: 76 mg/dL (ref 0.0–149.0)
VLDL: 15.2 mg/dL (ref 0.0–40.0)

## 2024-07-01 LAB — FOLATE: Folate: 5.8 ng/mL — ABNORMAL LOW (ref >5.9)

## 2024-07-01 LAB — TSH: TSH: 2.55 u[IU]/mL (ref 0.35–5.50)

## 2024-07-01 LAB — VITAMIN B12: Vitamin B-12: 276 pg/mL (ref 211–911)

## 2024-07-01 LAB — LYME DISEASE SEROLOGY W/REFLEX: Lyme Total Antibody EIA: NEGATIVE

## 2024-07-01 LAB — C-REACTIVE PROTEIN: CRP: <0.5 mg/dL (ref 0.5–20.0)

## 2024-07-04 ENCOUNTER — Telehealth: Payer: Self-pay

## 2024-07-04 ENCOUNTER — Ambulatory Visit: Payer: Self-pay | Admitting: Family Medicine

## 2024-07-04 NOTE — Telephone Encounter (Signed)
 Copied from CRM [id number]. Topic: Clinical - Request for Lab/Test Order >> Jul 04, 2024 12:48 PM Berneda FALCON wrote: Reason for CRM: Patient states Dr. Mercer told him to schedule a nurse visit for a B12 but I am not seeing it in the orders. Is this something the patient needs to be seen for? Do we need an order for this?  Patient callback is [phone]

## 2024-07-04 NOTE — Progress Notes (Signed)
 Called Patient and went over the lab results. Patient states please call in the medication and can he get a nurse visit for a B12 injection?

## 2024-07-04 NOTE — Telephone Encounter (Signed)
 Copied from CRM [id number]. Topic: Clinical - Prescription Issue >> Jul 01, 2024  3:57 PM Larissa RAMAN wrote: Reason for CRM: Patient states he needs prescription for shower stool faxed to [facility] at  fax # [phone]

## 2024-07-05 ENCOUNTER — Encounter: Payer: Self-pay | Admitting: Physician Assistant

## 2024-07-05 ENCOUNTER — Ambulatory Visit (INDEPENDENT_AMBULATORY_CARE_PROVIDER_SITE_OTHER): Admitting: Physician Assistant

## 2024-07-05 VITALS — BP 122/80 | HR 93 | Ht 68.0 in | Wt 187.0 lb

## 2024-07-05 DIAGNOSIS — R112 Nausea with vomiting, unspecified: Secondary | ICD-10-CM | POA: Diagnosis not present

## 2024-07-05 DIAGNOSIS — K219 Gastro-esophageal reflux disease without esophagitis: Secondary | ICD-10-CM

## 2024-07-05 DIAGNOSIS — Z860100 Personal history of colon polyps, unspecified: Secondary | ICD-10-CM

## 2024-07-05 DIAGNOSIS — Z860101 Personal history of adenomatous and serrated colon polyps: Secondary | ICD-10-CM

## 2024-07-05 DIAGNOSIS — F1090 Alcohol use, unspecified, uncomplicated: Secondary | ICD-10-CM

## 2024-07-05 DIAGNOSIS — K2940 Chronic atrophic gastritis without bleeding: Secondary | ICD-10-CM

## 2024-07-05 DIAGNOSIS — K2100 Gastro-esophageal reflux disease with esophagitis, without bleeding: Secondary | ICD-10-CM | POA: Diagnosis not present

## 2024-07-05 DIAGNOSIS — R7989 Other specified abnormal findings of blood chemistry: Secondary | ICD-10-CM

## 2024-07-05 MED ORDER — ONDANSETRON 4 MG PO TBDP
4.0000 mg | ORAL_TABLET | Freq: Every day | ORAL | 5 refills | Status: AC
Start: 2024-07-05 — End: ?

## 2024-07-05 MED ORDER — OMEPRAZOLE 40 MG PO CPDR: 40.0000 mg | DELAYED_RELEASE_CAPSULE | ORAL | 3 refills | Status: AC

## 2024-07-05 NOTE — Progress Notes (Signed)
 Ellouise Console, PA-C [address] [place], [place]  [address] Phone: [phone]   Primary Care Physician: Mercer Clotilda SAUNDERS, MD  Primary Gastroenterologist:  Ellouise Console, PA-C / Dr. Gordy Starch   Chief Complaint: Follow-up nausea, vomiting, elevated LFTs, alcohol use disorder, GERD       HPI:   Sean Mejia is a 73 y.o. male returns for follow-up of chronic nausea, vomiting, GERD, alcohol use disorder, and elevated liver transaminases.  02/16/24 EGD by Dr. Starch: 2 cm hiatal hernia, distal esophagitis with mucosal sloughing, mild gastritis, normal duodenum.  Gastric biopsy showed mild chronic gastritis.  Biopsies negative for H. pylori, metaplasia, or dysplasia.  Esophageal biopsies negative for eosinophilic esophagitis and Barrett's.  02/16/24 Colonoscopy by Dr. Starch: 18 mm tubular adenoma polyp removed from sigmoid colon.  2 mm hyperplastic polyp removed from descending colon.  Good prep.  Mild diverticulosis.  3-year repeat (01/2027).  H. pylori stool test was negative.  06/30/2024 last labs through his PCP: Normal CBC.  Persistent elevated AST 68, ALT 59.  Mild low sodium 127, chloride 89.  Low folate 5.8.  Vitamin B12 276.  He was started on folate and B12 supplements.  He continues to drink moderate alcohol daily.  He has tried alcohol rehab programs in the past.  He is trying to decrease and stop his alcohol consumption.  His nausea has greatly improved with taking 1 Zofran  daily.  He is also taking omeprazole  40 mg once daily with good control of acid reflux.  He is feeling a lot better with no GI concerns today.  Current Outpatient Medications  Medication Sig Dispense Refill   DULoxetine  (CYMBALTA ) 20 MG capsule TAKE 2 CAPSULES BY MOUTH EVERY DAY 180 capsule 1   gabapentin  (NEURONTIN ) 400 MG capsule TAKE 1 CAPSULE BY MOUTH 3 TIMES DAILY. 90 capsule 5   olmesartan -hydrochlorothiazide  (BENICAR  HCT) 40-25 MG tablet TAKE 1 TABLET BY MOUTH EVERY DAY 90 tablet 2    omeprazole  (PRILOSEC) 40 MG capsule Take 1 capsule (40 mg total) by mouth every other day. 90 capsule 3   ondansetron  (ZOFRAN -ODT) 4 MG disintegrating tablet Take 1 tablet (4 mg total) by mouth every 6 (six) hours as needed for nausea or vomiting. Please keep scheduled appointment for further refills 30 tablet 0   simvastatin  (ZOCOR ) 80 MG tablet TAKE 1 TABLET BY MOUTH EVERY DAY 90 tablet 1   No current facility-administered medications for this visit.    Allergies as of 07/05/2024 - Review Complete 07/05/2024  Allergen Reaction Noted   Amoxicillin Other (See Comments) 12/25/2006   Penicillins Other (See Comments) 05/16/2009    Past Medical History:  Diagnosis Date   Depression    GERD (gastroesophageal reflux disease)    no medications taken for this   Hyperlipidemia    Hypertension    Substance abuse (HCC) alcohol    Past Surgical History:  Procedure Laterality Date   COLONOSCOPY     EYE SURGERY     both eyes   HAND SURGERY Right    HAND SURGERY Left    HAND SURGERY Right    TONSILECTOMY, ADENOIDECTOMY, BILATERAL MYRINGOTOMY AND TUBES     UPPER GASTROINTESTINAL ENDOSCOPY      Review of Systems:    All systems reviewed and negative except where noted in HPI.    Physical Exam:  BP 122/80   Pulse 93   Ht 5' 8 (1.727 m)   Wt 187 lb (84.8 kg)   BMI  28.43 kg/m  No LMP for male patient.  General: Well-nourished, well-developed in no acute distress.  Lungs: Clear to auscultation bilaterally. Non-labored. Heart: Regular rate and rhythm, no murmurs rubs or gallops.  Abdomen: Bowel sounds are normal; Abdomen is Soft; No hepatosplenomegaly, masses or hernias;  No Abdominal Tenderness; No guarding or rebound tenderness. Neuro: Alert and oriented x 3.  Grossly intact.  Psych: Alert and cooperative, normal mood and affect.   Imaging Studies: No results found.  Labs: CBC    Component Value Date/Time   WBC 5.1 06/30/2024 1534   RBC 4.12 (L) 06/30/2024 1534   HGB  13.8 06/30/2024 1534   HCT 39.8 06/30/2024 1534   PLT 391.0 06/30/2024 1534   MCV 96.4 06/30/2024 1534   MCHC 34.6 06/30/2024 1534   RDW 13.6 06/30/2024 1534   LYMPHSABS 1.0 06/30/2024 1534   MONOABS 0.5 06/30/2024 1534   EOSABS 0.1 06/30/2024 1534   BASOSABS 0.0 06/30/2024 1534    CMP     Component Value Date/Time   NA 127 (L) 06/30/2024 1534   K 4.3 06/30/2024 1534   CL 89 (L) 06/30/2024 1534   CO2 26 06/30/2024 1534   GLUCOSE 64 (L) 06/30/2024 1534   BUN 9 06/30/2024 1534   CREATININE 0.85 06/30/2024 1534   CALCIUM 9.6 06/30/2024 1534   PROT 7.1 06/30/2024 1534   ALBUMIN 4.3 06/30/2024 1534   AST 68 (H) 06/30/2024 1534   ALT 59 (H) 06/30/2024 1534   ALKPHOS 103 06/30/2024 1534   BILITOT 0.8 06/30/2024 1534   GFRNONAA 67 06/16/2008 1545   GFRAA 81 06/16/2008 1545       Assessment and Plan:   [name] is a 73 y.o. y/o male returns for follow-up of:  1.  GERD with esophagitis.  Chronic nausea and vomiting currently controlled on low-dose Zofran . - Rx ondansetron  4 mg 1 tablet once daily, #30, 5 refills.  2.  Chronic gastritis (H. pylori negative): Improved on PPI. - Continue omeprazole  40 mg once daily, #90, 3 refills.  3.  Small hiatal hernia  4.  Alcohol use disorder causing elevated liver transaminases - Discussion regarding cessation of alcohol. - We discussed resources such as Alcoholics Anonymous, [facility] inpatient rehab, and medication. - Continue to follow-up with PCP to monitor LFTs and help with alcohol cessation.  5.  History of large 18 mm adenomatous polyp removed - Plan to repeat colonoscopy in 3 years 01/2027   Ellouise Console, PA-C  Follow up in 1 year to refill medication.  Sooner if recurrent or worsening GI symptoms.

## 2024-07-05 NOTE — Patient Instructions (Addendum)
 It was good to see you today.  I refilled omeprazole  40 mg 1 tablet once daily for 1 year.  Continue this medication for acid reflux.  I also gave refills for Zofran  (ondansetron ) 4 mg 1 tablet once daily for nausea and vomiting.  We would like to follow-up in 1 year in order to refill medication.  Please follow-up with us  sooner if you have any worsening GI symptoms or if we can be of any assistance.  Sincerely,  Ellouise Console, PA-C

## 2024-07-06 NOTE — Telephone Encounter (Signed)
 Per result note, patient was advised to take over-the-counter folate and B12 as lab results were low/low normal.

## 2024-07-06 NOTE — Telephone Encounter (Signed)
 Rx for shower stool was printed at time of office visit and given to patient.  Okay to reprint and fax.

## 2024-07-07 NOTE — Telephone Encounter (Signed)
 Patient is aware he is to take the OTC Vit B12

## 2024-07-11 ENCOUNTER — Telehealth: Payer: Self-pay

## 2024-07-11 ENCOUNTER — Ambulatory Visit

## 2024-07-11 NOTE — Progress Notes (Signed)
 Patient was scanned today for BIL balance braces has taken several falls  Also has referral to Neurologist  Patient feels insecure when participating in ADL's  Braces will increase support, proprioception and help reduce risk of injury by fall  Lolita Schultze Cped, CFo, CFm

## 2024-07-11 NOTE — Telephone Encounter (Signed)
 Order has been faxed

## 2024-07-11 NOTE — Telephone Encounter (Signed)
 Copied from CRM #[id number]. Topic: Clinical - Prescription Issue >> Jul 08, 2024  2:26 PM Paige D wrote: Reason for CRM: Pt calling in regards to prescription Shower stool pt is asking if that script can get sent over  to [facility]  fax # [phone].

## 2024-07-12 ENCOUNTER — Telehealth: Payer: Self-pay

## 2024-07-12 ENCOUNTER — Other Ambulatory Visit: Payer: Self-pay | Admitting: Podiatry

## 2024-07-12 ENCOUNTER — Telehealth: Payer: Self-pay | Admitting: Family Medicine

## 2024-07-12 DIAGNOSIS — Z0279 Encounter for issue of other medical certificate: Secondary | ICD-10-CM

## 2024-07-12 NOTE — Telephone Encounter (Signed)
 Copied from CRM #[id number]. Topic: Referral - Status >> Jul 11, 2024  4:34 PM Alfonso ORN wrote: Reason for CRM: pt called for follow up on neurology referral mentioned by dr Mercer. Advised I saw a referral order for neurology but no additional information. Please contact pt with update on referral [phone] (M)

## 2024-07-12 NOTE — Telephone Encounter (Signed)
 Disability parking placard is filled out. Patient is aware it is ready for pick-up at the front desk

## 2024-07-14 ENCOUNTER — Telehealth: Payer: Self-pay

## 2024-07-14 NOTE — Telephone Encounter (Signed)
 Copied from CRM [id number]. Topic: General - Other >> Jul 14, 2024  2:41 PM Burnard DEL wrote: Reason for CRM: Patient called in stating that [facility] neurology said that they are waiting on Office notes to be sent to them so that patient can be schedule.

## 2024-07-18 ENCOUNTER — Encounter: Payer: Self-pay | Admitting: Neurology

## 2024-07-18 ENCOUNTER — Other Ambulatory Visit: Payer: Self-pay | Admitting: Family Medicine

## 2024-07-18 DIAGNOSIS — E871 Hypo-osmolality and hyponatremia: Secondary | ICD-10-CM

## 2024-07-18 DIAGNOSIS — R7989 Other specified abnormal findings of blood chemistry: Secondary | ICD-10-CM

## 2024-07-20 ENCOUNTER — Telehealth: Payer: Self-pay

## 2024-07-20 NOTE — Telephone Encounter (Signed)
 Called patient Left a Detailed VM,  per Dr. Mercer- Lorrene Shows [birth year]b Jun 01, 2051 to have him come in to recheck his sodium.  it can be causing his dizziness.  also advised increased alcohol intake can cause dizziness, hyponatremia, the low folate, and elevated liver enzymes.

## 2024-07-21 ENCOUNTER — Other Ambulatory Visit (INDEPENDENT_AMBULATORY_CARE_PROVIDER_SITE_OTHER)

## 2024-07-21 ENCOUNTER — Ambulatory Visit: Payer: Self-pay | Admitting: Family Medicine

## 2024-07-21 DIAGNOSIS — R7989 Other specified abnormal findings of blood chemistry: Secondary | ICD-10-CM | POA: Diagnosis not present

## 2024-07-21 DIAGNOSIS — E871 Hypo-osmolality and hyponatremia: Secondary | ICD-10-CM

## 2024-07-21 DIAGNOSIS — I10 Essential (primary) hypertension: Secondary | ICD-10-CM

## 2024-07-21 DIAGNOSIS — R748 Abnormal levels of other serum enzymes: Secondary | ICD-10-CM

## 2024-07-21 DIAGNOSIS — R7401 Elevation of levels of liver transaminase levels: Secondary | ICD-10-CM

## 2024-07-21 LAB — COMPREHENSIVE METABOLIC PANEL WITH GFR
ALT: 53 U/L (ref 0–53)
AST: 55 U/L — ABNORMAL HIGH (ref 0–37)
Albumin: 4.4 g/dL (ref 3.5–5.2)
Alkaline Phosphatase: 120 U/L — ABNORMAL HIGH (ref 39–117)
BUN: 11 mg/dL (ref 6–23)
CO2: 28 meq/L (ref 19–32)
Calcium: 9.7 mg/dL (ref 8.4–10.5)
Chloride: 90 meq/L — ABNORMAL LOW (ref 96–112)
Creatinine, Ser: 0.92 mg/dL (ref 0.40–1.50)
GFR: 83.68 mL/min (ref >60.00)
Glucose, Bld: 80 mg/dL (ref 70–99)
Potassium: 4.3 meq/L (ref 3.5–5.1)
Sodium: 128 meq/L — ABNORMAL LOW (ref 135–145)
Total Bilirubin: 0.6 mg/dL (ref 0.2–1.2)
Total Protein: 6.9 g/dL (ref 6.0–8.3)

## 2024-07-21 MED ORDER — AMLODIPINE-OLMESARTAN 5-40 MG PO TABS: 1.0000 | ORAL_TABLET | Freq: Every day | ORAL | 3 refills | Status: DC

## 2024-07-28 ENCOUNTER — Telehealth: Payer: Self-pay

## 2024-07-28 NOTE — Telephone Encounter (Signed)
 LVM to schedule custom brace eval- I can squeeze him in today or tmrw if he calls back  First scan had errors/ gaps

## 2024-07-29 ENCOUNTER — Ambulatory Visit
Admission: RE | Admit: 2024-07-29 | Discharge: 2024-07-29 | Disposition: A | Discharge status: Home / Self Care | Source: Ambulatory Visit | Attending: Family Medicine | Admitting: Family Medicine

## 2024-07-29 DIAGNOSIS — R748 Abnormal levels of other serum enzymes: Secondary | ICD-10-CM

## 2024-07-29 DIAGNOSIS — R7401 Elevation of levels of liver transaminase levels: Secondary | ICD-10-CM

## 2024-08-01 ENCOUNTER — Ambulatory Visit: Payer: Self-pay | Admitting: Family Medicine

## 2024-08-08 ENCOUNTER — Other Ambulatory Visit

## 2024-08-09 ENCOUNTER — Other Ambulatory Visit

## 2024-08-09 ENCOUNTER — Encounter: Payer: Self-pay | Admitting: Neurology

## 2024-08-09 ENCOUNTER — Ambulatory Visit (INDEPENDENT_AMBULATORY_CARE_PROVIDER_SITE_OTHER): Admitting: Neurology

## 2024-08-09 ENCOUNTER — Telehealth: Payer: Self-pay

## 2024-08-09 VITALS — BP 140/93 | HR 94 | Ht 68.0 in | Wt 189.0 lb

## 2024-08-09 DIAGNOSIS — G621 Alcoholic polyneuropathy: Secondary | ICD-10-CM

## 2024-08-09 DIAGNOSIS — R278 Other lack of coordination: Secondary | ICD-10-CM | POA: Diagnosis not present

## 2024-08-09 NOTE — Telephone Encounter (Signed)
 Upon discharge from this appointment today patient wanted to ask Dr. Tobie a question. Patient stated that he saw an Ad for Wave therapy for neuropathy and wanted to know if Dr. Tobie has heard about this. Patient stated ok to send Mychart message with responses.

## 2024-08-09 NOTE — Patient Instructions (Addendum)
 Check labs  Continue home balance exercises  Try to cut back on alcohol   Check feet daily  Use cane, especially on uneven ground  You may want to consider getting medical alert system that you can use, if you were to have another fall.

## 2024-08-09 NOTE — Telephone Encounter (Signed)
Informed patient on Mychart

## 2024-08-09 NOTE — Telephone Encounter (Signed)
 Please let pt know that there are various alternative therapies marketed claiming to cure or treat neuropathy, none of which are evidence-based or that I recommend.  Thanks.

## 2024-08-09 NOTE — Progress Notes (Signed)
 [facility]rology [facility] Clinic Note - Initial Visit   Date: 08/09/2024   RYNE MCTIGUE MRN: [id number] DOB: 09/01/1951   Dear Dr. Mercer:  Thank you for your kind referral of Mazi W Westerhoff for consultation of neuropathy. Although his history is well known to you, please allow us  to reiterate it for the purpose of our medical record. The patient was accompanied to the clinic by self.   OLEY LAHAIE is a 73 y.o. right-handed male with GERD presenting for evaluation of neuropathy and falls.   IMPRESSION/PLAN: Alcohol-induced peripheral neuropathy manifesting with distal numbness, sensory ataxia, and falls.  Given his classic history and exam features consistent with neuropathy, I do not think NCS/EMG will be of high value, as it would not change management.  If his symptoms were to change, testing can be ordered.  Prior labs indicate low vitamin B12 and folate deficiency for which he is taking supplements.  Additional labs are noted below to check for other causes of neuropathy:  - Check vitamin B1, copper, SPEP with IFE  - Continue home balance exercises  - Continue gabapentin  400mg  TID, explained that this would not improve numbness  - Check feet daily  - Start using a cane, fall precautions discussed  2.  Essential tremor.   Intermittent tremors in hands with family history of tremor.   - Continue gabapentin  for tremor management. - Will consider medication change if tremors worsen.  3.  Alcohol use disorder.  Chronic alcohol use disorder with consumption of three to four drinks per week for 10-20 years. Alcohol may contribute to neuropathy and hyponatremia. - Advised reduction of alcohol intake to prevent worsening of neuropathy.  Low back pain with occasional right leg radiculopathy. No acute changes warranting immediate investigation. - Occasional shooting pain in the right leg, possibly related to low back issues.  - Monitor symptoms and will consider MRI  lumbar spine, if symptoms worsen or change.   Return to clinic in 6 months   ------------------------------------------------------------- History of present illness: Discussed the use of AI scribe software for clinical note transcription with the patient, who gave verbal consent to proceed.  History of Present Illness He has been experiencing balance issues, numbness, tingling, and falls for since 2020. Initially, tingling was noted in the toes of both feet. Over time, symptoms progressed to numbness extending past the ankles into the lower legs, with numbness now more prominent than tingling.  He was started on gabapentin  400mg  TID by his podiatrist which helped with tingling and pain. He has engaged in physical therapy and performs exercises on a foam cushion, although he admits to not keeping up with the exercises as much as he should.  He has experienced multiple falls, with the most severe resulting in a concussion at the end of September. Falls often occur when he is slightly off balance, particularly when turning corners. He uses a cane occasionally and has installed shower handles and a stool for safety.    No numbness or tingling in the hands, although he has a history of Dupuytren's contracture surgery. He reports occasional low back pain and shooting pain in the right leg, which is infrequent.  His social history includes alcohol consumption of three drinks per sitting, three to four times a week, over the past ten to twenty years, with periods of abstinence lasting up to six months. He does not smoke. He lives alone in a condominium and is retired from a career as an orthoptist.  His  family history is significant for a father with multiple sclerosis, and two brothers with tremors, one of whom is on medication for severe tremors.   Out-side paper records, electronic medical record, and images have been reviewed where available and summarized as:  Lab Results  Component  Value Date   HGBA1C 5.7 12/24/2020   Lab Results  Component Value Date   VITAMINB12 276 06/30/2024   Lab Results  Component Value Date   TSH 2.55 06/30/2024   No results found for: ESRSEDRATE, POCTSEDRATE  Past Medical History:  Diagnosis Date   Depression    GERD (gastroesophageal reflux disease)    no medications taken for this   Hyperlipidemia    Hypertension    Substance abuse (HCC) alcohol    Past Surgical History:  Procedure Laterality Date   COLONOSCOPY     EYE SURGERY     both eyes   HAND SURGERY Right    HAND SURGERY Left    HAND SURGERY Right    TONSILECTOMY, ADENOIDECTOMY, BILATERAL MYRINGOTOMY AND TUBES     UPPER GASTROINTESTINAL ENDOSCOPY       Medications:  Outpatient Encounter Medications as of 08/09/2024  Medication Sig   amLODipine-olmesartan  (AZOR) 5-40 MG tablet Take 1 tablet by mouth daily.   DULoxetine  (CYMBALTA ) 20 MG capsule TAKE 2 CAPSULES BY MOUTH EVERY DAY   gabapentin  (NEURONTIN ) 400 MG capsule TAKE 1 CAPSULE BY MOUTH 3 TIMES DAILY.   omeprazole  (PRILOSEC) 40 MG capsule Take 1 capsule (40 mg total) by mouth every other day.   ondansetron  (ZOFRAN -ODT) 4 MG disintegrating tablet Take 1 tablet (4 mg total) by mouth daily. Please keep scheduled appointment for further refills   simvastatin  (ZOCOR ) 80 MG tablet TAKE 1 TABLET BY MOUTH EVERY DAY   No facility-administered encounter medications on file as of 08/09/2024.    Allergies:  Allergies  Allergen Reactions   Amoxicillin Other (See Comments)    REACTION: unspecified   Penicillins Other (See Comments)    REACTION: hives    Family History: Family History  Problem Relation Age of Onset   COPD Mother    Lupus Mother    Heart attack Father    Cancer Brother        bladder and liver   Alcohol abuse Brother    Stroke Maternal Grandmother    Colon cancer Neg Hx    Esophageal cancer Neg Hx    Stomach cancer Neg Hx    Rectal cancer Neg Hx     Social History: Social History    Tobacco Use   Smoking status: Never   Smokeless tobacco: Never  Vaping Use   Vaping status: Never Used  Substance Use Topics   Alcohol use: Yes    Comment: 3-4 times a week   Drug use: No   Social History   Social History Narrative   Are you right handed or left handed? Right Handed   Are you currently employed ? No    What is your current occupation? Retired    Do you live at home alone? Yes   Who lives with you?    What type of home do you live in: 1 story or 2 story? One story home        Vital Signs:  BP (!) 140/93   Pulse 94   Ht 5' 8 (1.727 m)   Wt 189 lb (85.7 kg)   SpO2 98%   BMI 28.74 kg/m   Neurological Exam: MENTAL STATUS including orientation to  time, place, person, recent and remote memory, attention span and concentration, language, and fund of knowledge is normal.  Speech is not dysarthric.  CRANIAL NERVES: II:  No visual field defects.     III-IV-VI: Pupils equal round and reactive to light.  Normal conjugate, extra-ocular eye movements in all directions of gaze.  No nystagmus.  No ptosis.   V:  Normal facial sensation.    VII:  Normal facial symmetry and movements.   VIII:  Normal hearing and vestibular function.   IX-X:  Normal palatal movement.   XI:  Normal shoulder shrug and head rotation.   XII:  Normal tongue strength and range of motion, no deviation or fasciculation.  MOTOR:  Dupuytren contracture bilaterally.  No atrophy, fasciculations or abnormal movements.  No pronator drift.   Upper Extremity:  Right  Left  Deltoid  5/5   5/5   Biceps  5/5   5/5   Triceps  5/5   5/5   Wrist extensors  5/5   5/5   Wrist flexors  5/5   5/5   Finger extensors  5/5   5/5   Finger flexors  5/5   5/5   Dorsal interossei  5/5   5/5   Abductor pollicis  5/5   5/5   Tone (Ashworth scale)  0  0   Lower Extremity:  Right  Left  Hip flexors  5/5   5/5   Knee flexors  5/5   5/5   Knee extensors  5/5   5/5   Dorsiflexors  5/5   5/5   Plantarflexors   5/5   5/5   Toe extensors  5/5   5/5   Toe flexors  5/5   5/5   Tone (Ashworth scale)  0  0   MSRs:                                           Right        Left brachioradialis 2+  2+  biceps 2+  2+  triceps 2+  2+  patellar 2+  3+  ankle jerk 0  0  Hoffman no  no  plantar response down  down   SENSORY:  Vibration, temperature, and pin prick is absent distal to ankles bilaterally.  Rhomberg testing is positive.  COORDINATION/GAIT: Normal finger-to- nose-finger.  Intact rapid alternating movements bilaterally.  Gait is mildly wide-based, unassisted.  Unable to perform stressed or tandem gait.    Thank you for allowing me to participate in patient's care.  If I can answer any additional questions, I would be pleased to do so.    Sincerely,    Marriana Hibberd K. Tobie, DO

## 2024-08-14 LAB — IMMUNOFIXATION ELECTROPHORESIS
IgG (Immunoglobin G), Serum: 1145 mg/dL (ref 600–1540)
IgM, Serum: 64 mg/dL (ref 50–300)
Immunoglobulin A: <5 mg/dL — ABNORMAL LOW (ref 70–320)

## 2024-08-14 LAB — PROTEIN ELECTROPHORESIS, SERUM
Albumin ELP: 4.2 g/dL (ref 3.8–4.8)
Alpha 1: 0.3 g/dL (ref 0.2–0.3)
Alpha 2: 0.7 g/dL (ref 0.5–0.9)
Beta 2: 0.3 g/dL (ref 0.2–0.5)
Beta Globulin: 0.4 g/dL (ref 0.4–0.6)
Gamma Globulin: 1 g/dL (ref 0.8–1.7)
Total Protein: 6.9 g/dL (ref 6.1–8.1)

## 2024-08-14 LAB — COPPER, SERUM: Copper: 64 ug/dL — ABNORMAL LOW (ref 70–175)

## 2024-08-14 LAB — VITAMIN B1: Vitamin B1 (Thiamine): <6 nmol/L — ABNORMAL LOW (ref 8–30)

## 2024-08-15 ENCOUNTER — Ambulatory Visit: Payer: Self-pay | Admitting: Neurology

## 2024-08-15 ENCOUNTER — Ambulatory Visit

## 2024-09-06 ENCOUNTER — Ambulatory Visit: Admitting: Podiatrist

## 2024-09-06 DIAGNOSIS — M779 Enthesopathy, unspecified: Secondary | ICD-10-CM | POA: Diagnosis not present

## 2024-09-06 DIAGNOSIS — R2681 Unsteadiness on feet: Secondary | ICD-10-CM

## 2024-09-06 DIAGNOSIS — G629 Polyneuropathy, unspecified: Secondary | ICD-10-CM | POA: Diagnosis not present

## 2024-09-06 NOTE — Progress Notes (Signed)
 Patient presents today for a re scan for the [place]  AFO Balance Brace bilateral-  He has already seen Lolita and the scan was incomplete so he is here for a re scan.    Measurements were taken and a scan of bilateral feet and lower legs was accomplished   Braces will be ordered and we will call when ready for pick up.

## 2024-09-10 ENCOUNTER — Other Ambulatory Visit: Payer: Self-pay | Admitting: Family Medicine

## 2024-09-10 DIAGNOSIS — F339 Major depressive disorder, recurrent, unspecified: Secondary | ICD-10-CM

## 2024-10-12 ENCOUNTER — Telehealth: Payer: Self-pay | Admitting: Podiatry

## 2024-10-12 NOTE — Telephone Encounter (Signed)
 B/L foot braces are in the [place] office. Left a message for Kymir to call back and schedule an appointment with Dr. Deneen schedule to pick up the braces.

## 2024-10-14 ENCOUNTER — Ambulatory Visit: Admitting: Podiatrist

## 2024-10-14 DIAGNOSIS — M779 Enthesopathy, unspecified: Secondary | ICD-10-CM

## 2024-10-14 DIAGNOSIS — R2681 Unsteadiness on feet: Secondary | ICD-10-CM

## 2024-10-14 DIAGNOSIS — G629 Polyneuropathy, unspecified: Secondary | ICD-10-CM

## 2024-10-14 NOTE — Progress Notes (Signed)
 Sean Mejia presents today to pick up his balance braces for the right and left.  He is happy with the braces and states they are more comfortable than he anticipated.  The braces are fitting him well.  He is able to walk without restrictive movement    ICD-10-CM   1. Gait instability  R26.81     2. Neuropathy  G62.9     3. Tendonitis  M77.9      Braces dispensed.  He will call if there are any concerns with the brace.

## 2024-10-17 ENCOUNTER — Other Ambulatory Visit: Payer: Self-pay | Admitting: Family Medicine

## 2024-10-17 DIAGNOSIS — I10 Essential (primary) hypertension: Secondary | ICD-10-CM

## 2024-10-31 ENCOUNTER — Ambulatory Visit: Admitting: Family Medicine

## 2025-02-06 ENCOUNTER — Ambulatory Visit: Payer: Self-pay | Admitting: Neurology

## 2025-02-06 ENCOUNTER — Other Ambulatory Visit
